# Patient Record
Sex: Male | Born: 1965 | Race: White | Hispanic: No | Smoking: Never smoker
Health system: Southern US, Community
[De-identification: ages and names within clinical notes are randomized; demographics above are authoritative.]

## PROBLEM LIST (undated history)

## (undated) DIAGNOSIS — F1921 Other psychoactive substance dependence, in remission: Secondary | ICD-10-CM

## (undated) DIAGNOSIS — M51369 Other intervertebral disc degeneration, lumbar region without mention of lumbar back pain or lower extremity pain: Secondary | ICD-10-CM

## (undated) DIAGNOSIS — M7580 Other shoulder lesions, unspecified shoulder: Secondary | ICD-10-CM

## (undated) DIAGNOSIS — M4800 Spinal stenosis, site unspecified: Secondary | ICD-10-CM

## (undated) DIAGNOSIS — M1990 Unspecified osteoarthritis, unspecified site: Secondary | ICD-10-CM

## (undated) DIAGNOSIS — Z8489 Family history of other specified conditions: Secondary | ICD-10-CM

## (undated) DIAGNOSIS — K429 Umbilical hernia without obstruction or gangrene: Secondary | ICD-10-CM

## (undated) DIAGNOSIS — F319 Bipolar disorder, unspecified: Secondary | ICD-10-CM

## (undated) DIAGNOSIS — R51 Headache: Secondary | ICD-10-CM

## (undated) DIAGNOSIS — G8929 Other chronic pain: Secondary | ICD-10-CM

## (undated) DIAGNOSIS — M5136 Other intervertebral disc degeneration, lumbar region: Secondary | ICD-10-CM

## (undated) DIAGNOSIS — I10 Essential (primary) hypertension: Secondary | ICD-10-CM

## (undated) HISTORY — PX: CYSTECTOMY: SUR359

## (undated) HISTORY — PX: MULTIPLE TOOTH EXTRACTIONS: SHX2053

## (undated) HISTORY — DX: Other psychoactive substance dependence, in remission: F19.21

---

## 2000-10-28 ENCOUNTER — Emergency Department (HOSPITAL_COMMUNITY): Admission: EM | Admit: 2000-10-28 | Discharge: 2000-10-28 | Payer: Self-pay | Admitting: Emergency Medicine

## 2001-11-07 ENCOUNTER — Encounter: Payer: Self-pay | Admitting: Internal Medicine

## 2001-11-07 ENCOUNTER — Emergency Department (HOSPITAL_COMMUNITY): Admission: EM | Admit: 2001-11-07 | Discharge: 2001-11-07 | Payer: Self-pay | Admitting: Internal Medicine

## 2002-05-15 ENCOUNTER — Emergency Department (HOSPITAL_COMMUNITY): Admission: EM | Admit: 2002-05-15 | Discharge: 2002-05-15 | Payer: Self-pay | Admitting: Emergency Medicine

## 2002-05-15 ENCOUNTER — Encounter: Payer: Self-pay | Admitting: Internal Medicine

## 2002-09-28 ENCOUNTER — Emergency Department (HOSPITAL_COMMUNITY): Admission: EM | Admit: 2002-09-28 | Discharge: 2002-09-28 | Payer: Self-pay | Admitting: Emergency Medicine

## 2010-04-10 ENCOUNTER — Emergency Department (HOSPITAL_COMMUNITY)
Admission: EM | Admit: 2010-04-10 | Discharge: 2010-04-10 | Disposition: A | Discharge status: Home / Self Care | Payer: Self-pay | Attending: Emergency Medicine | Admitting: Emergency Medicine

## 2010-04-10 DIAGNOSIS — M545 Low back pain, unspecified: Secondary | ICD-10-CM | POA: Insufficient documentation

## 2010-04-26 ENCOUNTER — Emergency Department (HOSPITAL_COMMUNITY)
Admission: EM | Admit: 2010-04-26 | Discharge: 2010-04-26 | Disposition: A | Discharge status: Home / Self Care | Payer: Self-pay | Attending: Emergency Medicine | Admitting: Emergency Medicine

## 2010-04-26 DIAGNOSIS — M545 Low back pain, unspecified: Secondary | ICD-10-CM | POA: Insufficient documentation

## 2010-04-26 DIAGNOSIS — F411 Generalized anxiety disorder: Secondary | ICD-10-CM | POA: Insufficient documentation

## 2010-04-26 DIAGNOSIS — M129 Arthropathy, unspecified: Secondary | ICD-10-CM | POA: Insufficient documentation

## 2010-04-26 DIAGNOSIS — F319 Bipolar disorder, unspecified: Secondary | ICD-10-CM | POA: Insufficient documentation

## 2010-05-20 ENCOUNTER — Emergency Department (HOSPITAL_COMMUNITY)
Admission: EM | Admit: 2010-05-20 | Discharge: 2010-05-20 | Disposition: A | Discharge status: Home / Self Care | Payer: Self-pay | Attending: Emergency Medicine | Admitting: Emergency Medicine

## 2010-05-20 DIAGNOSIS — F411 Generalized anxiety disorder: Secondary | ICD-10-CM | POA: Insufficient documentation

## 2010-05-20 DIAGNOSIS — M129 Arthropathy, unspecified: Secondary | ICD-10-CM | POA: Insufficient documentation

## 2010-05-20 DIAGNOSIS — IMO0002 Reserved for concepts with insufficient information to code with codable children: Secondary | ICD-10-CM | POA: Insufficient documentation

## 2010-05-20 DIAGNOSIS — F319 Bipolar disorder, unspecified: Secondary | ICD-10-CM | POA: Insufficient documentation

## 2010-07-11 ENCOUNTER — Emergency Department (HOSPITAL_COMMUNITY)
Admission: EM | Admit: 2010-07-11 | Discharge: 2010-07-11 | Disposition: A | Discharge status: Home / Self Care | Payer: Self-pay | Attending: Emergency Medicine | Admitting: Emergency Medicine

## 2010-07-11 DIAGNOSIS — M545 Low back pain, unspecified: Secondary | ICD-10-CM | POA: Insufficient documentation

## 2010-07-30 ENCOUNTER — Encounter: Payer: Self-pay | Admitting: Emergency Medicine

## 2010-07-30 ENCOUNTER — Emergency Department (HOSPITAL_COMMUNITY)
Admission: EM | Admit: 2010-07-30 | Discharge: 2010-07-30 | Disposition: A | Discharge status: Home / Self Care | Payer: Self-pay | Attending: Emergency Medicine | Admitting: Emergency Medicine

## 2010-07-30 DIAGNOSIS — M545 Low back pain, unspecified: Secondary | ICD-10-CM | POA: Insufficient documentation

## 2010-07-30 DIAGNOSIS — F319 Bipolar disorder, unspecified: Secondary | ICD-10-CM | POA: Insufficient documentation

## 2010-07-30 DIAGNOSIS — F988 Other specified behavioral and emotional disorders with onset usually occurring in childhood and adolescence: Secondary | ICD-10-CM | POA: Insufficient documentation

## 2010-07-30 DIAGNOSIS — X500XXA Overexertion from strenuous movement or load, initial encounter: Secondary | ICD-10-CM | POA: Insufficient documentation

## 2010-07-30 DIAGNOSIS — G8929 Other chronic pain: Secondary | ICD-10-CM | POA: Insufficient documentation

## 2010-07-30 HISTORY — DX: Other chronic pain: G89.29

## 2010-07-30 HISTORY — DX: Bipolar disorder, unspecified: F31.9

## 2010-07-30 MED ORDER — PREDNISONE 10 MG PO TABS: 10.0000 mg | ORAL_TABLET | Freq: Every day | ORAL | Status: AC

## 2010-07-30 MED ORDER — CYCLOBENZAPRINE HCL 10 MG PO TABS: 10.0000 mg | ORAL_TABLET | Freq: Three times a day (TID) | ORAL | Status: AC | PRN

## 2010-07-30 MED ORDER — OXYCODONE-ACETAMINOPHEN 5-325 MG PO TABS: 1.0000 | ORAL_TABLET | ORAL | Status: AC | PRN

## 2010-07-30 MED ORDER — OXYCODONE-ACETAMINOPHEN 5-325 MG PO TABS
1.0000 | ORAL_TABLET | Freq: Once | ORAL | Status: AC
  Administered 2010-07-30: 1 via ORAL
  Filled 2010-07-30: qty 1

## 2010-07-30 NOTE — ED Notes (Signed)
Pt c/o chronic back pain. Moving furniture yesterday and now c/olower back pain that is radiating down L leg. NAD at this time. Rating pain 10 with movment, 8 without.

## 2010-07-30 NOTE — ED Provider Notes (Signed)
History     Chief Complaint  Patient presents with  . Back Pain   Patient is a 45 y.o. male presenting with back pain. The history is provided by the patient.  Back Pain  This is a recurrent problem. The current episode started yesterday. The problem occurs constantly. The problem has been gradually worsening. The pain is associated with lifting heavy objects and twisting. The pain is present in the lumbar spine. The quality of the pain is described as burning and stabbing. The pain radiates to the left thigh. The pain is moderate. The symptoms are aggravated by bending, certain positions and twisting. The pain is the same all the time. Stiffness is present in the morning. Pertinent negatives include no fever, no numbness, no abdominal pain, no bowel incontinence, no perianal numbness, no bladder incontinence, no dysuria, no paresthesias, no tingling and no weakness. He has tried nothing for the symptoms. Risk factors include obesity.    Past Medical History  Diagnosis Date  . Bipolar 1 disorder   . Attention deficit disorder   . Chronic pain     Past Surgical History  Procedure Date  . Cystectomy     History reviewed. No pertinent family history.  History  Substance Use Topics  . Smoking status: Never Smoker   . Smokeless tobacco: Not on file  . Alcohol Use: No      Review of Systems  Constitutional: Negative.  Negative for fever.  HENT: Negative for neck pain and neck stiffness.   Respiratory: Negative.   Cardiovascular: Negative.   Gastrointestinal: Negative.  Negative for abdominal pain and bowel incontinence.  Genitourinary: Negative for bladder incontinence, dysuria and frequency.       No incontinence  Musculoskeletal: Positive for back pain. Negative for myalgias and gait problem.  Skin: Negative.   Neurological: Negative for tingling, weakness, numbness and paresthesias.    Physical Exam  BP 151/87  Pulse 75  Temp(Src) 98.6 F (37 C) (Oral)  Resp 19   SpO2 97%  Physical Exam  Constitutional: He is oriented to person, place, and time. Vital signs are normal. He appears well-developed and well-nourished.  Non-toxic appearance.  HENT:  Head: Normocephalic and atraumatic.  Eyes: Pupils are equal, round, and reactive to light.  Neck: Neck supple.  Cardiovascular: Normal rate, regular rhythm and normal heart sounds.   Pulmonary/Chest: Effort normal and breath sounds normal.  Abdominal: Soft. Bowel sounds are normal.  Musculoskeletal: He exhibits tenderness.       Lumbar back: He exhibits pain. He exhibits no tenderness, no swelling and no edema.       Back:  Lymphadenopathy:    He has no cervical adenopathy.  Neurological: He is alert and oriented to person, place, and time.  Skin: Skin is warm and dry.    ED Course  Procedures  MDM  ttp of the left lumbar paraspinal muscles.  Patient is ambulatory, no focal neuro deficits .  Distal sensation intact      Davan Hark L. [place], [place] 07/30/10 [phone]

## 2010-08-30 ENCOUNTER — Emergency Department (HOSPITAL_COMMUNITY)
Admission: EM | Admit: 2010-08-30 | Discharge: 2010-08-30 | Disposition: A | Discharge status: Home / Self Care | Payer: Self-pay | Attending: Emergency Medicine | Admitting: Emergency Medicine

## 2010-08-30 ENCOUNTER — Emergency Department (HOSPITAL_COMMUNITY): Payer: Self-pay

## 2010-08-30 ENCOUNTER — Encounter (HOSPITAL_COMMUNITY): Payer: Self-pay | Admitting: *Deleted

## 2010-08-30 DIAGNOSIS — M545 Low back pain, unspecified: Secondary | ICD-10-CM | POA: Insufficient documentation

## 2010-08-30 DIAGNOSIS — M25571 Pain in right ankle and joints of right foot: Secondary | ICD-10-CM

## 2010-08-30 DIAGNOSIS — M25579 Pain in unspecified ankle and joints of unspecified foot: Secondary | ICD-10-CM | POA: Insufficient documentation

## 2010-08-30 MED ORDER — DICLOFENAC SODIUM 75 MG PO TBEC: 75.0000 mg | DELAYED_RELEASE_TABLET | Freq: Two times a day (BID) | ORAL | Status: AC

## 2010-08-30 MED ORDER — OXYCODONE-ACETAMINOPHEN 5-325 MG PO TABS
1.0000 | ORAL_TABLET | Freq: Once | ORAL | Status: AC
  Administered 2010-08-30: 1 via ORAL
  Filled 2010-08-30: qty 1

## 2010-08-30 MED ORDER — OXYCODONE-ACETAMINOPHEN 5-325 MG PO TABS: 1.0000 | ORAL_TABLET | ORAL | Status: AC | PRN

## 2010-08-30 NOTE — ED Notes (Signed)
Patient with back pain x 2-3 days, bilat. Ankle pain for a week

## 2010-08-30 NOTE — ED Notes (Signed)
Ankle splint applied to R ankle.

## 2010-08-30 NOTE — ED Provider Notes (Signed)
History     CSN: [id number] Arrival date & time: 08/30/2010  6:53 PM  Chief Complaint  Patient presents with  . Back Pain  . Ankle Pain   HPI Comments: (atient c/o low back pain for 3 days.  States the pain is worse with movement.  He denies pain radiating into his legs, numbness or weakness or incontinence of feces or urine.  States he has had similar back pains in the past.  He also c/o aching pain in his bilateral ankles but right ankle pain is worse than left.  He denies redness, swelling or injury of the back or ankles.  Ankle pain is also worse with movement and standing.  The pain does not radiate to his calf.  Patient is a 45 y.o. male presenting with back pain. The history is provided by the patient.  Back Pain  The current episode started more than 2 days ago. The problem occurs constantly. The problem has not changed since onset.The pain is associated with no known injury. The pain is present in the lumbar spine. The quality of the pain is described as aching. The pain does not radiate. The pain is moderate. The symptoms are aggravated by bending, twisting and certain positions. The pain is the same all the time. Pertinent negatives include no chest pain, no fever, no numbness, no headaches, no abdominal pain, no bowel incontinence, no perianal numbness, no bladder incontinence, no pelvic pain, no leg pain, no paresthesias, no tingling and no weakness. He has tried analgesics for the symptoms. The treatment provided no relief.    Past Medical History  Diagnosis Date  . Bipolar 1 disorder   . Attention deficit disorder   . Chronic pain     Past Surgical History  Procedure Date  . Cystectomy     History reviewed. No pertinent family history.  History  Substance Use Topics  . Smoking status: Never Smoker   . Smokeless tobacco: Not on file  . Alcohol Use: No      Review of Systems  Constitutional: Negative for fever.  Cardiovascular: Negative for chest pain.    Gastrointestinal: Negative for abdominal pain and bowel incontinence.  Genitourinary: Negative for bladder incontinence and pelvic pain.  Musculoskeletal: Positive for back pain and arthralgias.  Neurological: Negative for tingling, weakness, numbness, headaches and paresthesias.  All other systems reviewed and are negative.    Physical Exam  BP 140/85  Pulse 67  Temp(Src) 98.3 F (36.8 C) (Oral)  Resp 20  Ht 5\' 11"  (1.803 m)  Wt 285 lb (129.275 kg)  BMI 39.75 kg/m2  SpO2 98%  Physical Exam  Nursing note and vitals reviewed. Constitutional: He is oriented to person, place, and time. He appears well-developed and well-nourished. No distress.  HENT:  Head: Normocephalic and atraumatic.  Mouth/Throat: Oropharynx is clear and moist.  Neck: Normal range of motion. Neck supple. No JVD present.  Cardiovascular: Normal rate, regular rhythm and normal heart sounds.   Pulmonary/Chest: Effort normal and breath sounds normal.  Musculoskeletal: He exhibits tenderness. He exhibits no edema.       Lumbar back: He exhibits tenderness. He exhibits normal range of motion, no bony tenderness and no swelling.       Back:       Feet:  Lymphadenopathy:    He has no cervical adenopathy.  Neurological: He is alert and oriented to person, place, and time. He has normal reflexes. He displays normal reflexes. No cranial nerve deficit. Coordination normal.  Skin:  Skin is warm and dry.    ED Course  Procedures  MDM   ttp of the right medial and lateral ankle.  No edema, erythema, crepitus or bruising.  Patient is ambulatory with a limp.   No calf pain or hx of gout.  ASO splint applied to the right ankle by the nursing staff.  Neurovascular recheck of ankle intact.        Quantel Mcinturff L. [place], [place] 09/07/10 1844

## 2010-09-13 NOTE — ED Provider Notes (Signed)
Medical screening examination/treatment/procedure(s) were performed by non-physician practitioner and as supervising physician I was immediately available for consultation/collaboration.  Jasmine Awe, MD 09/13/10 [phone]

## 2010-09-30 ENCOUNTER — Emergency Department (HOSPITAL_COMMUNITY)
Admission: EM | Admit: 2010-09-30 | Discharge: 2010-09-30 | Disposition: A | Discharge status: Home / Self Care | Payer: Medicaid Other | Attending: Emergency Medicine | Admitting: Emergency Medicine

## 2010-09-30 ENCOUNTER — Encounter (HOSPITAL_COMMUNITY): Payer: Self-pay

## 2010-09-30 DIAGNOSIS — IMO0002 Reserved for concepts with insufficient information to code with codable children: Secondary | ICD-10-CM | POA: Insufficient documentation

## 2010-09-30 DIAGNOSIS — X500XXA Overexertion from strenuous movement or load, initial encounter: Secondary | ICD-10-CM | POA: Insufficient documentation

## 2010-09-30 DIAGNOSIS — M549 Dorsalgia, unspecified: Secondary | ICD-10-CM

## 2010-09-30 DIAGNOSIS — M545 Low back pain, unspecified: Secondary | ICD-10-CM | POA: Insufficient documentation

## 2010-09-30 HISTORY — DX: Unspecified osteoarthritis, unspecified site: M19.90

## 2010-09-30 HISTORY — DX: Other intervertebral disc degeneration, lumbar region without mention of lumbar back pain or lower extremity pain: M51.369

## 2010-09-30 HISTORY — DX: Other intervertebral disc degeneration, lumbar region: M51.36

## 2010-09-30 HISTORY — DX: Other shoulder lesions, unspecified shoulder: M75.80

## 2010-09-30 MED ORDER — MORPHINE SULFATE 4 MG/ML IJ SOLN
4.0000 mg | Freq: Once | INTRAMUSCULAR | Status: AC
  Administered 2010-09-30: 4 mg via INTRAMUSCULAR
  Filled 2010-09-30: qty 1

## 2010-09-30 MED ORDER — METHOCARBAMOL 500 MG PO TABS
1000.0000 mg | ORAL_TABLET | Freq: Once | ORAL | Status: AC
  Administered 2010-09-30: 1000 mg via ORAL
  Filled 2010-09-30: qty 2

## 2010-09-30 MED ORDER — ACETAMINOPHEN-CODEINE #3 300-30 MG PO TABS: 1.0000 | ORAL_TABLET | ORAL | Status: AC | PRN

## 2010-09-30 MED ORDER — DICLOFENAC SODIUM 75 MG PO TBEC: 75.0000 mg | DELAYED_RELEASE_TABLET | Freq: Two times a day (BID) | ORAL | Status: DC

## 2010-09-30 MED ORDER — PROMETHAZINE HCL 12.5 MG PO TABS
25.0000 mg | ORAL_TABLET | Freq: Once | ORAL | Status: AC
  Administered 2010-09-30: 25 mg via ORAL
  Filled 2010-09-30: qty 1

## 2010-09-30 NOTE — ED Provider Notes (Signed)
Medical screening examination/treatment/procedure(s) were performed by non-physician practitioner and as supervising physician I was immediately available for consultation/collaboration.   Joya Gaskins, MD 09/30/10 [phone]

## 2010-09-30 NOTE — ED Provider Notes (Signed)
History     CSN: [id number] Arrival date & time: 09/30/2010  3:20 PM  Chief Complaint  Patient presents with  . Back Pain  . Back Injury   HPI Comments: Pt states he was moving wood from a truck and positioning the tongue of trailer and injured the lower back. He has hx of low back pain.  Patient is a 45 y.o. male presenting with back pain. The history is provided by the patient.  Back Pain  This is a recurrent problem. The current episode started yesterday. The problem occurs constantly. The problem has been gradually worsening. The pain is associated with lifting heavy objects. The pain is present in the lumbar spine. The quality of the pain is described as aching. The pain is at a severity of 10/10. The pain is moderate. The symptoms are aggravated by certain positions. He has tried nothing for the symptoms.    Past Medical History  Diagnosis Date  . Bipolar 1 disorder   . Attention deficit disorder   . Chronic pain   . AC (acromioclavicular) joint bone spurs   . Arthritis   . Degenerative disc disease, lumbar     Past Surgical History  Procedure Date  . Cystectomy     No family history on file.  History  Substance Use Topics  . Smoking status: Never Smoker   . Smokeless tobacco: Not on file  . Alcohol Use: No      Review of Systems  Musculoskeletal: Positive for back pain.    Physical Exam  BP 176/88  Pulse 73  Resp 20  Ht 5\' 11"  (1.803 m)  Wt 285 lb (129.275 kg)  BMI 39.75 kg/m2  SpO2 98%  Physical Exam  Nursing note and vitals reviewed. Constitutional: He is oriented to person, place, and time. He appears well-developed and well-nourished.  Non-toxic appearance.  HENT:  Head: Normocephalic.  Right Ear: Tympanic membrane and external ear normal.  Left Ear: Tympanic membrane and external ear normal.  Eyes: EOM and lids are normal. Pupils are equal, round, and reactive to light.  Neck: Normal range of motion. Neck supple. Carotid bruit is not present.    Cardiovascular: Normal rate, regular rhythm, normal heart sounds, intact distal pulses and normal pulses.   Pulmonary/Chest: Breath sounds normal. No respiratory distress.  Abdominal: Soft. Bowel sounds are normal. There is no tenderness. There is no guarding.  Musculoskeletal: Normal range of motion.       Lumbar area pain to palpation and ROM.  Lymphadenopathy:       Head (right side): No submandibular adenopathy present.       Head (left side): No submandibular adenopathy present.    He has no cervical adenopathy.  Neurological: He is alert and oriented to person, place, and time. He has normal strength. No cranial nerve deficit or sensory deficit. He exhibits normal muscle tone. Coordination normal. GCS eye subscore is 4. GCS verbal subscore is 5. GCS motor subscore is 6.  Skin: Skin is warm and dry.  Psychiatric: He has a normal mood and affect. His speech is normal.    ED Course: Pt advised to rest back. Use voltaren and tylenol -codeine for discomfort. Pt to see Dr Eduard Clos for additional evaluation.  Procedures  MDM I have reviewed nursing notes, vital signs, and all appropriate lab and imaging results for this patient.      Kathie Dike, [place] 09/30/10 [phone]

## 2010-09-30 NOTE — ED Notes (Signed)
Pt reports injury to his back yesterday while moving some wood off a truck.  Pt has hx of back problems.

## 2010-10-12 ENCOUNTER — Emergency Department (HOSPITAL_COMMUNITY)
Admission: EM | Admit: 2010-10-12 | Discharge: 2010-10-13 | Disposition: A | Discharge status: Home / Self Care | Payer: Medicaid Other | Attending: Emergency Medicine | Admitting: Emergency Medicine

## 2010-10-12 ENCOUNTER — Encounter (HOSPITAL_COMMUNITY): Payer: Self-pay | Admitting: Emergency Medicine

## 2010-10-12 DIAGNOSIS — M549 Dorsalgia, unspecified: Secondary | ICD-10-CM | POA: Insufficient documentation

## 2010-10-12 DIAGNOSIS — M129 Arthropathy, unspecified: Secondary | ICD-10-CM | POA: Insufficient documentation

## 2010-10-12 DIAGNOSIS — M51379 Other intervertebral disc degeneration, lumbosacral region without mention of lumbar back pain or lower extremity pain: Secondary | ICD-10-CM | POA: Insufficient documentation

## 2010-10-12 DIAGNOSIS — F319 Bipolar disorder, unspecified: Secondary | ICD-10-CM | POA: Insufficient documentation

## 2010-10-12 DIAGNOSIS — G8929 Other chronic pain: Secondary | ICD-10-CM | POA: Insufficient documentation

## 2010-10-12 DIAGNOSIS — M5137 Other intervertebral disc degeneration, lumbosacral region: Secondary | ICD-10-CM | POA: Insufficient documentation

## 2010-10-12 MED ORDER — METHOCARBAMOL 500 MG PO TABS: ORAL_TABLET | ORAL | Status: DC

## 2010-10-12 MED ORDER — OXYCODONE-ACETAMINOPHEN 5-325 MG PO TABS
1.0000 | ORAL_TABLET | Freq: Once | ORAL | Status: AC
  Administered 2010-10-13: 1 via ORAL
  Filled 2010-10-12: qty 1

## 2010-10-12 MED ORDER — METHOCARBAMOL 500 MG PO TABS
1000.0000 mg | ORAL_TABLET | Freq: Once | ORAL | Status: AC
  Administered 2010-10-13: 1000 mg via ORAL
  Filled 2010-10-12: qty 2

## 2010-10-12 MED ORDER — OXYCODONE-ACETAMINOPHEN 5-325 MG PO TABS: 1.0000 | ORAL_TABLET | ORAL | Status: AC | PRN

## 2010-10-12 MED ORDER — PREDNISONE 10 MG PO TABS: ORAL_TABLET | ORAL | Status: AC

## 2010-10-12 NOTE — ED Provider Notes (Signed)
History     CSN: [id number] Arrival date & time: 10/12/2010  9:07 PM  Chief Complaint  Patient presents with  . Back Pain    HPI  (Consider location/radiation/quality/duration/timing/severity/associated sxs/prior treatment)  Patient is a 45 y.o. male presenting with back pain. The history is provided by the patient.  Back Pain  This is a recurrent problem. The current episode started more than 1 week ago. The problem occurs constantly. The problem has been gradually worsening. The pain is associated with no known injury. The pain is present in the lumbar spine. The quality of the pain is described as shooting and aching. The pain radiates to the left thigh. The pain is moderate. The symptoms are aggravated by bending and certain positions. The pain is the same all the time. Associated symptoms include leg pain and tingling. Pertinent negatives include no chest pain, no fever, no numbness, no abdominal pain, no abdominal swelling, no bowel incontinence, no perianal numbness, no bladder incontinence, no dysuria, no pelvic pain, no paresthesias and no weakness. He has tried analgesics and NSAIDs for the symptoms. The treatment provided mild relief. Risk factors include obesity.    Past Medical History  Diagnosis Date  . Bipolar 1 disorder   . Attention deficit disorder   . Chronic pain   . AC (acromioclavicular) joint bone spurs   . Arthritis   . Degenerative disc disease, lumbar     Past Surgical History  Procedure Date  . Cystectomy     History reviewed. No pertinent family history.  History  Substance Use Topics  . Smoking status: Never Smoker   . Smokeless tobacco: Not on file  . Alcohol Use: No      Review of Systems  Review of Systems  Constitutional: Negative for fever.  Cardiovascular: Negative for chest pain.  Gastrointestinal: Negative for abdominal pain and bowel incontinence.  Genitourinary: Negative for bladder incontinence, dysuria, urgency, hematuria,  decreased urine volume, difficulty urinating and pelvic pain.  Musculoskeletal: Positive for back pain and gait problem. Negative for myalgias and joint swelling.  Skin: Negative.   Neurological: Positive for tingling. Negative for weakness, numbness and paresthesias.  Hematological: Negative for adenopathy. Does not bruise/bleed easily.  All other systems reviewed and are negative.    Allergies  Review of patient's allergies indicates no known allergies.  Home Medications   Current Outpatient Rx  Name Route Sig Dispense Refill  . ACETAMINOPHEN 500 MG PO TABS Oral Take 1,000 mg by mouth as needed. For pain     . SALINE SENSITIVE EYES SOLN Does not apply 1 drop by Does not apply route as needed. CONTACTS     . TYLENOL PO Oral Take 1,000 mg by mouth as needed. For pain    . DICLOFENAC SODIUM 75 MG PO TBEC Oral Take 1 tablet (75 mg total) by mouth 2 (two) times daily. Take with food 20 tablet 0  . DICLOFENAC SODIUM 75 MG PO TBEC Oral Take 1 tablet (75 mg total) by mouth 2 (two) times daily. 14 tablet 0    Physical Exam    BP 163/99  Pulse 80  Temp(Src) 98.7 F (37.1 C) (Oral)  Resp 20  Ht 6' (1.829 m)  Wt 285 lb (129.275 kg)  BMI 38.65 kg/m2  SpO2 100%  Physical Exam  Nursing note and vitals reviewed. Constitutional: He is oriented to person, place, and time. He appears well-developed and well-nourished. No distress.  HENT:  Head: Normocephalic and atraumatic.  Neck: Normal range of motion.  Neck supple.  Cardiovascular: Normal rate, regular rhythm and normal heart sounds.   Abdominal: Soft. He exhibits no mass. There is no tenderness. There is no rebound and no guarding.  Musculoskeletal: He exhibits tenderness. He exhibits no edema.  Lymphadenopathy:    He has no cervical adenopathy.  Neurological: He is alert and oriented to person, place, and time. He has normal strength and normal reflexes. No cranial nerve deficit or sensory deficit. He exhibits normal muscle tone.  Coordination normal.  Reflex Scores:      Patellar reflexes are 2+ on the right side and 2+ on the left side.      Achilles reflexes are 2+ on the right side and 2+ on the left side. Skin: Skin is dry.    ED Course  Procedures (including critical care time)       MDM  11:48 PM patient has ttp of the left lumbar paraspinal muscles.  No focal neuro deficits, ambulates with slightly antalgic gait.  Patient has hx of chronic back pain.     I have reviewed the patient's previous medical records.  He has been evaluated here multiple times for back pain    Dewan Emond L. [place], [place] 10/12/10 2357

## 2010-10-12 NOTE — ED Notes (Signed)
Patient c/o low back pain that he states is chronic.

## 2010-10-12 NOTE — ED Notes (Signed)
C/o chronic lower back pain that has gotten worse

## 2010-10-24 NOTE — ED Provider Notes (Signed)
Medical screening examination/treatment/procedure(s) were performed by non-physician practitioner and as supervising physician I was immediately available for consultation/collaboration.   Madellyn Denio W. Riyanshi [place], MD 10/24/10 1251 

## 2010-10-24 NOTE — ED Provider Notes (Signed)
Medical screening examination/treatment/procedure(s) were performed by non-physician practitioner and as supervising physician I was immediately available for consultation/collaboration.  Nicoletta Dress. [place], MD 10/24/10 [phone]

## 2010-11-08 ENCOUNTER — Encounter (HOSPITAL_COMMUNITY): Payer: Self-pay

## 2010-11-08 ENCOUNTER — Ambulatory Visit (HOSPITAL_COMMUNITY): Payer: Self-pay | Admitting: Physical Therapy

## 2010-11-08 ENCOUNTER — Emergency Department (HOSPITAL_COMMUNITY)
Admission: EM | Admit: 2010-11-08 | Discharge: 2010-11-08 | Disposition: A | Discharge status: Home / Self Care | Payer: Medicaid Other | Attending: Emergency Medicine | Admitting: Emergency Medicine

## 2010-11-08 DIAGNOSIS — M545 Low back pain, unspecified: Secondary | ICD-10-CM | POA: Insufficient documentation

## 2010-11-08 DIAGNOSIS — M898X9 Other specified disorders of bone, unspecified site: Secondary | ICD-10-CM | POA: Insufficient documentation

## 2010-11-08 DIAGNOSIS — G8929 Other chronic pain: Secondary | ICD-10-CM | POA: Insufficient documentation

## 2010-11-08 DIAGNOSIS — M5137 Other intervertebral disc degeneration, lumbosacral region: Secondary | ICD-10-CM | POA: Insufficient documentation

## 2010-11-08 DIAGNOSIS — M47817 Spondylosis without myelopathy or radiculopathy, lumbosacral region: Secondary | ICD-10-CM | POA: Insufficient documentation

## 2010-11-08 DIAGNOSIS — M51379 Other intervertebral disc degeneration, lumbosacral region without mention of lumbar back pain or lower extremity pain: Secondary | ICD-10-CM | POA: Insufficient documentation

## 2010-11-08 DIAGNOSIS — M4800 Spinal stenosis, site unspecified: Secondary | ICD-10-CM | POA: Insufficient documentation

## 2010-11-08 DIAGNOSIS — M549 Dorsalgia, unspecified: Secondary | ICD-10-CM

## 2010-11-08 DIAGNOSIS — F319 Bipolar disorder, unspecified: Secondary | ICD-10-CM | POA: Insufficient documentation

## 2010-11-08 HISTORY — DX: Spinal stenosis, site unspecified: M48.00

## 2010-11-08 MED ORDER — MORPHINE SULFATE 4 MG/ML IJ SOLN
4.0000 mg | Freq: Once | INTRAMUSCULAR | Status: AC
  Administered 2010-11-08: 4 mg via INTRAMUSCULAR
  Filled 2010-11-08: qty 1

## 2010-11-08 MED ORDER — OXYCODONE-ACETAMINOPHEN 5-325 MG PO TABS: 2.0000 | ORAL_TABLET | ORAL | Status: AC | PRN

## 2010-11-08 NOTE — ED Notes (Signed)
Pt reports has arthritis in back, herniated discs, and spinal stenosis.  C/O pain in lower back.

## 2010-11-08 NOTE — ED Provider Notes (Signed)
History   This chart was scribed for Nicholes Stairs, MD by Clarita Crane. The patient was seen in room APA19/AP[facility]19 and the patient's care was started at 12:10PM.   CSN: [id number] Arrival date & time: 11/08/2010 11:21 AM   First MD Initiated Contact with Patient 11/08/10 1141      Chief Complaint  Patient presents with  . Back Pain   HPI Walter [name] is a 45 y.o. male who presents to the Emergency Department complaining of constant sharp lower back pain onset several months ago but significantly worse over the past several days with associated weakness and tingling of bilateral lower extremities. Denies nausea, vomiting, fever, chills, incontinence. Patient reports he was recently dx with spinal stenosis and is in the process of becoming a new patient of [facility] but has not been approved at this time.   Past Medical History  Diagnosis Date  . Bipolar 1 disorder   . Attention deficit disorder   . Chronic pain   . AC (acromioclavicular) joint bone spurs   . Arthritis   . Degenerative disc disease, lumbar   . Spinal stenosis     Past Surgical History  Procedure Date  . Cystectomy     No family history on file.  History  Substance Use Topics  . Smoking status: Never Smoker   . Smokeless tobacco: Not on file  . Alcohol Use: No      Review of Systems 10 Systems reviewed and are negative for acute change except as noted in the HPI.  Allergies  Review of patient's allergies indicates no known allergies.  Home Medications   Current Outpatient Rx  Name Route Sig Dispense Refill  . TYLENOL PO Oral Take 1,000 mg by mouth as needed. For pain    . ACETAMINOPHEN 500 MG PO TABS Oral Take 1,000 mg by mouth every 6 (six) hours as needed. For pain    . DICLOFENAC SODIUM 75 MG PO TBEC Oral Take 1 tablet (75 mg total) by mouth 2 (two) times daily. 14 tablet 0  . METHOCARBAMOL 500 MG PO TABS  Take two tabs po TID prn muscle spasms 42 tablet 0  . SALINE  SENSITIVE EYES SOLN Does not apply 1 drop by Does not apply route as needed. CONTACTS     . DICLOFENAC SODIUM 75 MG PO TBEC Oral Take 1 tablet (75 mg total) by mouth 2 (two) times daily. Take with food 20 tablet 0    BP 137/88  Pulse 69  Temp(Src) 98.3 F (36.8 C) (Oral)  Resp 20  Ht 5\' 11"  (1.803 m)  Wt 295 lb (133.811 kg)  BMI 41.14 kg/m2  SpO2 99%  Physical Exam  Nursing note and vitals reviewed. Constitutional: He is oriented to person, place, and time. He appears well-developed and well-nourished. No distress.  HENT:  Head: Normocephalic and atraumatic.  Eyes: EOM are normal.  Neck: Neck supple. No tracheal deviation present.  Cardiovascular: Normal rate.   Pulmonary/Chest: Effort normal. No respiratory distress.  Musculoskeletal: Normal range of motion. He exhibits no edema.       Entire spine non-tender.   Neurological: He is alert and oriented to person, place, and time. He has normal strength. No sensory deficit.  Skin: Skin is warm and dry.  Psychiatric: He has a normal mood and affect. His behavior is normal.    ED Course  Procedures (including critical care time)  DIAGNOSTIC STUDIES: Oxygen Saturation is 99% on room air, normal  by  my interpretation.    COORDINATION OF CARE:    Labs Reviewed - No data to display No results found.   No diagnosis found.    MDM  Back pain No history of trauma.  No systemic illness.  No weakness or paresthesias.  No neurological deficits.      I personally performed the services described in this documentation, which was scribed in my presence. The recorded information has been reviewed and considered.    Nicholes Stairs, MD 11/08/10 1220

## 2010-12-12 ENCOUNTER — Emergency Department (HOSPITAL_COMMUNITY)
Admission: EM | Admit: 2010-12-12 | Discharge: 2010-12-12 | Disposition: A | Discharge status: Home / Self Care | Payer: Medicaid Other | Attending: Emergency Medicine | Admitting: Emergency Medicine

## 2010-12-12 ENCOUNTER — Encounter (HOSPITAL_COMMUNITY): Payer: Self-pay | Admitting: Emergency Medicine

## 2010-12-12 DIAGNOSIS — M549 Dorsalgia, unspecified: Secondary | ICD-10-CM | POA: Insufficient documentation

## 2010-12-12 DIAGNOSIS — M898X9 Other specified disorders of bone, unspecified site: Secondary | ICD-10-CM | POA: Insufficient documentation

## 2010-12-12 DIAGNOSIS — M5137 Other intervertebral disc degeneration, lumbosacral region: Secondary | ICD-10-CM | POA: Insufficient documentation

## 2010-12-12 DIAGNOSIS — M51379 Other intervertebral disc degeneration, lumbosacral region without mention of lumbar back pain or lower extremity pain: Secondary | ICD-10-CM | POA: Insufficient documentation

## 2010-12-12 DIAGNOSIS — F319 Bipolar disorder, unspecified: Secondary | ICD-10-CM | POA: Insufficient documentation

## 2010-12-12 DIAGNOSIS — M129 Arthropathy, unspecified: Secondary | ICD-10-CM | POA: Insufficient documentation

## 2010-12-12 DIAGNOSIS — R209 Unspecified disturbances of skin sensation: Secondary | ICD-10-CM | POA: Insufficient documentation

## 2010-12-12 DIAGNOSIS — F988 Other specified behavioral and emotional disorders with onset usually occurring in childhood and adolescence: Secondary | ICD-10-CM | POA: Insufficient documentation

## 2010-12-12 DIAGNOSIS — M4800 Spinal stenosis, site unspecified: Secondary | ICD-10-CM | POA: Insufficient documentation

## 2010-12-12 DIAGNOSIS — G8929 Other chronic pain: Secondary | ICD-10-CM | POA: Insufficient documentation

## 2010-12-12 MED ORDER — METHOCARBAMOL 500 MG PO TABS: ORAL_TABLET | ORAL | Status: DC

## 2010-12-12 MED ORDER — DICLOFENAC SODIUM 75 MG PO TBEC: 75.0000 mg | DELAYED_RELEASE_TABLET | Freq: Two times a day (BID) | ORAL | Status: AC

## 2010-12-12 MED ORDER — KETOROLAC TROMETHAMINE 60 MG/2ML IM SOLN
60.0000 mg | Freq: Once | INTRAMUSCULAR | Status: AC
  Administered 2010-12-12: 60 mg via INTRAMUSCULAR
  Filled 2010-12-12: qty 2

## 2010-12-12 MED ORDER — OXYCODONE-ACETAMINOPHEN 5-325 MG PO TABS
2.0000 | ORAL_TABLET | Freq: Once | ORAL | Status: AC
  Administered 2010-12-12: 2 via ORAL
  Filled 2010-12-12: qty 2

## 2010-12-12 NOTE — ED Provider Notes (Signed)
History     CSN: [id number] Arrival date & time: 12/12/2010  5:24 AM   First MD Initiated Contact with Patient 12/12/10 0534      Chief Complaint  Patient presents with  . Back Pain    (Consider location/radiation/quality/duration/timing/severity/associated sxs/prior treatment) HPI Comments: Pt has history of chronic back pain.  He has had some numbness in his right leg. Pt is trying to see a back doctor but it make take until February.  Patient is a 45 y.o. male presenting with back pain. The history is provided by the patient.  Back Pain  This is a chronic problem. Episode onset: today. The problem occurs constantly. The problem has not changed since onset.The pain is associated with no known injury. The pain is present in the lumbar spine. The quality of the pain is described as stabbing. The pain is severe. The symptoms are aggravated by bending and twisting. Pertinent negatives include no abdominal pain, no abdominal swelling and no weakness.  Pt states he has had MRIs in the past showing pinched nerves and chronic degenerative changes.  Past Medical History  Diagnosis Date  . Bipolar 1 disorder   . Attention deficit disorder   . Chronic pain   . AC (acromioclavicular) joint bone spurs   . Arthritis   . Degenerative disc disease, lumbar   . Spinal stenosis     Past Surgical History  Procedure Date  . Cystectomy     History reviewed. No pertinent family history.  History  Substance Use Topics  . Smoking status: Never Smoker   . Smokeless tobacco: Not on file  . Alcohol Use: No      Review of Systems  Gastrointestinal: Negative for abdominal pain.  Musculoskeletal: Positive for back pain.  Neurological: Negative for weakness.  All other systems reviewed and are negative.    Allergies  Review of patient's allergies indicates no known allergies.  Home Medications   Current Outpatient Rx  Name Route Sig Dispense Refill  . TYLENOL PO Oral Take 1,000 mg  by mouth as needed. For pain    . ACETAMINOPHEN 500 MG PO TABS Oral Take 1,000 mg by mouth every 6 (six) hours as needed. For pain    . DICLOFENAC SODIUM 75 MG PO TBEC Oral Take 1 tablet (75 mg total) by mouth 2 (two) times daily. Take with food 20 tablet 0  . DICLOFENAC SODIUM 75 MG PO TBEC Oral Take 1 tablet (75 mg total) by mouth 2 (two) times daily. 14 tablet 0  . METHOCARBAMOL 500 MG PO TABS  Take two tabs po TID prn muscle spasms 42 tablet 0  . SALINE SENSITIVE EYES SOLN Does not apply 1 drop by Does not apply route as needed. CONTACTS       BP 180/88  Pulse 85  Temp(Src) 98.3 F (36.8 C) (Oral)  Resp 20  Ht 5\' 11"  (1.803 m)  Wt 310 lb (140.615 kg)  BMI 43.24 kg/m2  SpO2 96%  Physical Exam  Nursing note and vitals reviewed. Constitutional: He appears well-developed and well-nourished.  HENT:  Head: Normocephalic and atraumatic.  Right Ear: External ear normal.  Left Ear: External ear normal.  Nose: Nose normal.  Eyes: Conjunctivae and EOM are normal.  Neck: Neck supple. No tracheal deviation present.  Pulmonary/Chest: Effort normal. No stridor. No respiratory distress.  Musculoskeletal: He exhibits no edema and no tenderness.       Lumbar back: He exhibits decreased range of motion, tenderness, pain and spasm. He  exhibits no swelling and no edema.  Neurological: He is alert. He is not disoriented. No cranial nerve deficit or sensory deficit. He exhibits normal muscle tone. Coordination normal.  Skin: Skin is warm and dry. No rash noted. He is not diaphoretic. No erythema.  Psychiatric: He has a normal mood and affect. His behavior is normal. Thought content normal.    ED Course  Procedures (including critical care time)  Labs Reviewed - No data to display No results found.   1. Chronic back pain       MDM  Old records have been reviewed.  Pt has been coming to the ED multiple times in the past, almost monthly.  Pt given pain meds in the ED.  No sign of acute  neurological or vascular emergency associated with pt's back pain.  May have a component of sciatica.  Safe for outpatient follow up.  Discussed with patient chronic pain management. I do not recommend continuing percocet for pain.         Celene Kras, MD 12/12/10 [phone]

## 2010-12-12 NOTE — ED Notes (Signed)
Patient c/o back pain and numbness in right leg starting last night. Patient has history of same. States "it has never hurt this bad before."

## 2012-01-17 ENCOUNTER — Emergency Department (HOSPITAL_COMMUNITY)
Admission: EM | Admit: 2012-01-17 | Discharge: 2012-01-17 | Discharge status: Against Medical Advice | Payer: Medicaid Other

## 2012-01-23 ENCOUNTER — Encounter (HOSPITAL_COMMUNITY): Payer: Self-pay

## 2012-01-23 ENCOUNTER — Emergency Department (HOSPITAL_COMMUNITY): Payer: Medicaid Other

## 2012-01-23 ENCOUNTER — Emergency Department (HOSPITAL_COMMUNITY)
Admission: EM | Admit: 2012-01-23 | Discharge: 2012-01-23 | Disposition: A | Discharge status: Home / Self Care | Payer: Medicaid Other | Attending: Emergency Medicine | Admitting: Emergency Medicine

## 2012-01-23 DIAGNOSIS — W010XXA Fall on same level from slipping, tripping and stumbling without subsequent striking against object, initial encounter: Secondary | ICD-10-CM | POA: Insufficient documentation

## 2012-01-23 DIAGNOSIS — Z8659 Personal history of other mental and behavioral disorders: Secondary | ICD-10-CM | POA: Insufficient documentation

## 2012-01-23 DIAGNOSIS — Y939 Activity, unspecified: Secondary | ICD-10-CM | POA: Insufficient documentation

## 2012-01-23 DIAGNOSIS — Y929 Unspecified place or not applicable: Secondary | ICD-10-CM | POA: Insufficient documentation

## 2012-01-23 DIAGNOSIS — S92352A Displaced fracture of fifth metatarsal bone, left foot, initial encounter for closed fracture: Secondary | ICD-10-CM

## 2012-01-23 DIAGNOSIS — Z8739 Personal history of other diseases of the musculoskeletal system and connective tissue: Secondary | ICD-10-CM | POA: Insufficient documentation

## 2012-01-23 DIAGNOSIS — S92309A Fracture of unspecified metatarsal bone(s), unspecified foot, initial encounter for closed fracture: Secondary | ICD-10-CM | POA: Insufficient documentation

## 2012-01-23 MED ORDER — OXYCODONE-ACETAMINOPHEN 5-325 MG PO TABS: ORAL_TABLET | ORAL | Status: DC

## 2012-01-23 MED ORDER — OXYCODONE-ACETAMINOPHEN 5-325 MG PO TABS
1.0000 | ORAL_TABLET | Freq: Once | ORAL | Status: AC
  Administered 2012-01-23: 1 via ORAL
  Filled 2012-01-23: qty 1

## 2012-01-23 NOTE — ED Notes (Signed)
Pt reports tripped over a baby gate last Thursday.  C/O pain to left foot.  Bruising noted to left 2nd, 3rd, and 4 toes, and c/o severe pain to left lateral part of foot. Pedal pulse present, pt can wiggle toes but not without pain.

## 2012-01-23 NOTE — ED Provider Notes (Signed)
History     CSN: [id number]  Arrival date & time 01/23/12  1017   First MD Initiated Contact with Patient 01/23/12 1053      Chief Complaint  Patient presents with  . Foot Pain    (Consider location/radiation/quality/duration/timing/severity/associated sxs/prior treatment) HPI Comments: Stepped over a baby-gate and caught his toe causing him to fall and injuring L foot.  Went to [facility] and has known 5th MT fx..  States he thinks he would feel much better if we could fit him with a splint of some kind and some crutches to help him get around.   Patient is a 47 y.o. male presenting with lower extremity pain. The history is provided by the patient. No language interpreter was used.  Foot Pain This is a new problem. Episode onset: 1 week ago. The problem occurs constantly. The problem has been gradually worsening. Pertinent negatives include no fever. The symptoms are aggravated by walking and standing.    Past Medical History  Diagnosis Date  . Bipolar 1 disorder   . Attention deficit disorder   . Chronic pain   . AC (acromioclavicular) joint bone spurs   . Arthritis   . Degenerative disc disease, lumbar   . Spinal stenosis     Past Surgical History  Procedure Date  . Cystectomy     No family history on file.  History  Substance Use Topics  . Smoking status: Never Smoker   . Smokeless tobacco: Not on file  . Alcohol Use: No      Review of Systems  Constitutional: Negative for fever.  Musculoskeletal:       Foot injury  Skin: Negative for wound.  All other systems reviewed and are negative.    Allergies  Review of patient's allergies indicates no known allergies.  Home Medications   Current Outpatient Rx  Name  Route  Sig  Dispense  Refill  . ACETAMINOPHEN 500 MG PO TABS   Oral   Take 1,000 mg by mouth every 6 (six) hours as needed. For pain         . SALINE SENSITIVE EYES SOLN   Does not apply   1 drop by Does not apply route as needed. CONTACTS            . OXYCODONE-ACETAMINOPHEN 5-325 MG PO TABS      One tab po q 6 hrs prn pain   15 tablet   0     BP 154/81  Pulse 79  Temp 98.4 F (36.9 C) (Oral)  Resp 20  Ht 5\' 11"  (1.803 m)  Wt 295 lb (133.811 kg)  BMI 41.14 kg/m2  SpO2 98%  Physical Exam  Nursing note and vitals reviewed. Constitutional: He is oriented to person, place, and time. He appears well-developed and well-nourished.  HENT:  Head: Normocephalic and atraumatic.  Eyes: EOM are normal.  Neck: Normal range of motion.  Cardiovascular: Normal rate, regular rhythm and intact distal pulses.   Pulmonary/Chest: Effort normal. No respiratory distress.  Abdominal: Soft. He exhibits no distension. There is no tenderness.  Musculoskeletal: He exhibits tenderness.       Left foot: He exhibits decreased range of motion, tenderness, bony tenderness and swelling. He exhibits normal capillary refill, no crepitus, no deformity and no laceration.       Feet:  Neurological: He is alert and oriented to person, place, and time.  Skin: Skin is warm and dry.  Psychiatric: He has a normal mood and affect. Judgment normal.  ED Course  Procedures (including critical care time)  Labs Reviewed - No data to display Dg Foot Complete Left  01/23/2012  *RADIOLOGY REPORT*  Clinical Data: Pain and bruising of the fifth digit secondary to blunt trauma.  LEFT FOOT - COMPLETE 3+ VIEW  Comparison: 01/17/2012  Findings: Again noted is the a spiral fracture of the shaft of the fifth metatarsal with no significant displacement and no angulation.  No new injuries.  The patient has moderate arthritis of the ankle joint with dorsal spurring on the talus and navicular.   IMPRESSION: No acute abnormality.  The nondisplaced fracture of the shaft of the fifth metatarsal is unchanged since the study of 01/17/2012.   Original Report Authenticated By: Francene Boyers, M.D.      1. Displaced fracture of fifth metatarsal bone of left foot        MDM  Cam walker, crutches, ice, elevation. Ibuprofen rx-percocet F/u with dr. Hilda Lias prn        Evalina Field, PA 01/23/12 1210  Evalina Field, PA 01/29/12 2241

## 2012-01-23 NOTE — ED Notes (Signed)
Patient with no complaints at this time. Respirations even and unlabored. Skin warm/dry. Discharge instructions reviewed with patient at this time. Patient given opportunity to voice concerns/ask questions. Patient discharged at this time and left Emergency Department with steady gait.   

## 2012-01-30 NOTE — ED Provider Notes (Signed)
Medical screening examination/treatment/procedure(s) were performed by non-physician practitioner and as supervising physician I was immediately available for consultation/collaboration.   Shelda Jakes, MD 01/30/12 2351

## 2012-05-06 ENCOUNTER — Encounter (HOSPITAL_COMMUNITY): Payer: Self-pay

## 2012-05-06 ENCOUNTER — Emergency Department (HOSPITAL_COMMUNITY)
Admission: EM | Admit: 2012-05-06 | Discharge: 2012-05-06 | Disposition: A | Discharge status: Home / Self Care | Payer: Medicaid Other | Attending: Emergency Medicine | Admitting: Emergency Medicine

## 2012-05-06 DIAGNOSIS — Z8659 Personal history of other mental and behavioral disorders: Secondary | ICD-10-CM | POA: Insufficient documentation

## 2012-05-06 DIAGNOSIS — M545 Low back pain, unspecified: Secondary | ICD-10-CM | POA: Insufficient documentation

## 2012-05-06 DIAGNOSIS — Z8739 Personal history of other diseases of the musculoskeletal system and connective tissue: Secondary | ICD-10-CM | POA: Insufficient documentation

## 2012-05-06 DIAGNOSIS — Z8781 Personal history of (healed) traumatic fracture: Secondary | ICD-10-CM | POA: Insufficient documentation

## 2012-05-06 MED ORDER — HYDROMORPHONE HCL PF 1 MG/ML IJ SOLN
1.0000 mg | Freq: Once | INTRAMUSCULAR | Status: AC
  Administered 2012-05-06: 1 mg via INTRAMUSCULAR
  Filled 2012-05-06: qty 1

## 2012-05-06 MED ORDER — CYCLOBENZAPRINE HCL 10 MG PO TABS: 10.0000 mg | ORAL_TABLET | Freq: Two times a day (BID) | ORAL | Status: DC | PRN

## 2012-05-06 MED ORDER — HYDROCODONE-ACETAMINOPHEN 7.5-325 MG/15ML PO SOLN: 10.0000 mL | Freq: Four times a day (QID) | ORAL | Status: DC | PRN

## 2012-05-06 MED ORDER — CYCLOBENZAPRINE HCL 10 MG PO TABS
10.0000 mg | ORAL_TABLET | Freq: Once | ORAL | Status: AC
  Administered 2012-05-06: 10 mg via ORAL
  Filled 2012-05-06: qty 1

## 2012-05-06 MED ORDER — IBUPROFEN 800 MG PO TABS
800.0000 mg | ORAL_TABLET | Freq: Once | ORAL | Status: AC
  Administered 2012-05-06: 800 mg via ORAL
  Filled 2012-05-06: qty 1

## 2012-05-06 MED ORDER — IBUPROFEN 800 MG PO TABS: 800.0000 mg | ORAL_TABLET | Freq: Three times a day (TID) | ORAL | Status: DC

## 2012-05-06 NOTE — ED Provider Notes (Signed)
History     CSN: [id number]  Arrival date & time 05/06/12  0272   First MD Initiated Contact with Patient 05/06/12 0309      Chief Complaint  Patient presents with  . Back Pain    (Consider location/radiation/quality/duration/timing/severity/associated sxs/prior treatment) HPI Hx per PT - low back pain present for years, worse with movement and twisting/ bending. 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 No recalled trauma otherwise. No weakness, numbness or incontinence. H/o same, has had MRI in the past. Currently does not have any medications and is not taking anything. He broke his foot in Dec, has gained 30 pounds since that time and states he needs to lose wt and get back into exercising. Symptoms today are the same as his typical chronic pains, just more severe Past Medical History  Diagnosis Date  . Bipolar 1 disorder   . Attention deficit disorder   . Chronic pain   . AC (acromioclavicular) joint bone spurs   . Arthritis   . Degenerative disc disease, lumbar   . Spinal stenosis     Past Surgical History  Procedure Laterality Date  . Cystectomy      No family history on file.  History  Substance Use Topics  . Smoking status: Never Smoker   . Smokeless tobacco: Not on file  . Alcohol Use: No      Review of Systems  Constitutional: Negative for fever and chills.  HENT: Negative for neck pain and neck stiffness.   Eyes: Negative for pain.  Respiratory: Negative for shortness of breath.   Cardiovascular: Negative for chest pain.  Gastrointestinal: Negative for abdominal pain.  Genitourinary: Negative for dysuria.  Musculoskeletal: Positive for back pain.  Skin: Negative for rash.  Neurological: Negative for headaches.  All other systems reviewed and are negative.    Allergies  Review of patient's allergies indicates no known allergies.  Home Medications    Current Outpatient Rx  Name  Route  Sig  Dispense  Refill  . acetaminophen (TYLENOL) 500 MG tablet   Oral   Take 1,000 mg by mouth every 6 (six) hours as needed. For pain         . ibuprofen (ADVIL,MOTRIN) 200 MG tablet   Oral   Take 200 mg by mouth every 6 (six) hours as needed for pain.         Marland Kitchen oxyCODONE-acetaminophen (PERCOCET/ROXICET) 5-325 MG per tablet      One tab po q 6 hrs prn pain   15 tablet   0   . Soft Lens Products (SALINE SENSITIVE EYES) SOLN   Does not apply   1 drop by Does not apply route as needed. CONTACTS            BP 162/91  Pulse 78  Temp(Src) 98.3 F (36.8 C) (Oral)  Resp 16  Ht 5\' 11"  (1.803 m)  Wt 310 lb (140.615 kg)  BMI 43.26 kg/m2  SpO2 98%  Physical Exam  Constitutional: He is oriented to person, place, and time. He appears well-developed and well-nourished.  HENT:  Head: Normocephalic and atraumatic.  Eyes: EOM are normal. Pupils are equal, round, and reactive to light.  Neck: Neck supple.  Cardiovascular: Normal rate, regular rhythm and intact distal pulses.   Pulmonary/Chest: Effort normal and breath sounds normal. No respiratory distress.  Abdominal: Soft. Bowel sounds are normal.  Musculoskeletal:  Normal range of motion. He exhibits no edema.  Tender lower lumbar no midline deformity. No LED deficits. Equal DTRs, sensorium to light touch and strengths with dorsi/ plantar flexion, knee extension. Gait intact   Neurological: He is alert and oriented to person, place, and time.  Skin: Skin is warm and dry.    ED Course  Procedures (including critical care time)  Ice and medications provided  Outpatient referrals. Back pain precautions provided/ verbalized as understood.  MDM  LBP no red flags to suggest indication for emergent MRI  VS, previous records and nursing notes reviewed        Sunnie Nielsen, MD 05/06/12 [phone]

## 2012-05-06 NOTE — ED Notes (Signed)
Pt has had this back pain off and on for a long time,  States pain worse over past several days.

## 2012-05-20 ENCOUNTER — Emergency Department (HOSPITAL_COMMUNITY)
Admission: EM | Admit: 2012-05-20 | Discharge: 2012-05-20 | Disposition: A | Discharge status: Home / Self Care | Payer: Medicaid Other | Attending: Emergency Medicine | Admitting: Emergency Medicine

## 2012-05-20 ENCOUNTER — Encounter (HOSPITAL_COMMUNITY): Payer: Self-pay | Admitting: Emergency Medicine

## 2012-05-20 DIAGNOSIS — M5416 Radiculopathy, lumbar region: Secondary | ICD-10-CM

## 2012-05-20 DIAGNOSIS — Z8739 Personal history of other diseases of the musculoskeletal system and connective tissue: Secondary | ICD-10-CM | POA: Insufficient documentation

## 2012-05-20 DIAGNOSIS — IMO0002 Reserved for concepts with insufficient information to code with codable children: Secondary | ICD-10-CM | POA: Insufficient documentation

## 2012-05-20 DIAGNOSIS — R300 Dysuria: Secondary | ICD-10-CM | POA: Insufficient documentation

## 2012-05-20 DIAGNOSIS — M545 Low back pain, unspecified: Secondary | ICD-10-CM | POA: Insufficient documentation

## 2012-05-20 DIAGNOSIS — Z8659 Personal history of other mental and behavioral disorders: Secondary | ICD-10-CM | POA: Insufficient documentation

## 2012-05-20 DIAGNOSIS — R358 Other polyuria: Secondary | ICD-10-CM | POA: Insufficient documentation

## 2012-05-20 DIAGNOSIS — R209 Unspecified disturbances of skin sensation: Secondary | ICD-10-CM | POA: Insufficient documentation

## 2012-05-20 DIAGNOSIS — M6281 Muscle weakness (generalized): Secondary | ICD-10-CM | POA: Insufficient documentation

## 2012-05-20 DIAGNOSIS — R3589 Other polyuria: Secondary | ICD-10-CM | POA: Insufficient documentation

## 2012-05-20 DIAGNOSIS — G8929 Other chronic pain: Secondary | ICD-10-CM | POA: Insufficient documentation

## 2012-05-20 LAB — URINALYSIS, ROUTINE W REFLEX MICROSCOPIC
Leukocytes, UA: NEGATIVE
Protein, ur: NEGATIVE mg/dL
Specific Gravity, Urine: >1.03 — ABNORMAL HIGH (ref 1.005–1.030)
Urobilinogen, UA: 0.2 mg/dL (ref 0.0–1.0)

## 2012-05-20 LAB — GLUCOSE, CAPILLARY: Glucose-Capillary: 94 mg/dL (ref 70–99)

## 2012-05-20 MED ORDER — METHOCARBAMOL 500 MG PO TABS
500.0000 mg | ORAL_TABLET | Freq: Once | ORAL | Status: AC
  Administered 2012-05-20: 500 mg via ORAL
  Filled 2012-05-20: qty 1

## 2012-05-20 MED ORDER — KETOROLAC TROMETHAMINE 60 MG/2ML IM SOLN
60.0000 mg | Freq: Once | INTRAMUSCULAR | Status: AC
  Administered 2012-05-20: 60 mg via INTRAMUSCULAR
  Filled 2012-05-20: qty 2

## 2012-05-20 MED ORDER — OXYCODONE-ACETAMINOPHEN 5-325 MG PO TABS
1.0000 | ORAL_TABLET | Freq: Once | ORAL | Status: AC
  Administered 2012-05-20: 1 via ORAL
  Filled 2012-05-20: qty 1

## 2012-05-20 MED ORDER — HYDROCODONE-ACETAMINOPHEN 7.5-325 MG/15ML PO SOLN: 10.0000 mL | Freq: Four times a day (QID) | ORAL | Status: DC | PRN

## 2012-05-20 NOTE — ED Provider Notes (Signed)
History  This chart was scribed for Loren Racer, MD, by Candelaria Stagers, ED Scribe. This patient was seen in room [facility]11/[facility]11 and the patient's care was started at 8:01PM   CSN: [id number]  Arrival date & time 05/20/12  4098   First MD Initiated Contact with Patient 05/20/12 1951      Chief Complaint  Patient presents with  . Polyuria  . Testicle Pain     The history is provided by the patient. No language interpreter was used.   Walter [name] is a 47 y.o. male who presents to the Emergency Department complaining of increased urination and tingling to his right testicle that started about 9 days ago.  Pt has h/o lower back pain and reports his pain started radiating to his groin area one month ago.  He also reports increased bilateral leg weakness and tingling.  Pt denies dysuria or penile discharge.  Pt is sexually active and has no h/o STDs.  Nothing seems to make the sx better or worse.    Past Medical History  Diagnosis Date  . Bipolar 1 disorder   . Attention deficit disorder   . Chronic pain   . AC (acromioclavicular) joint bone spurs   . Arthritis   . Degenerative disc disease, lumbar   . Spinal stenosis     Past Surgical History  Procedure Laterality Date  . Cystectomy      History reviewed. No pertinent family history.  History  Substance Use Topics  . Smoking status: Never Smoker   . Smokeless tobacco: Not on file  . Alcohol Use: No      Review of Systems  Endocrine: Positive for polyuria.  Genitourinary: Positive for dysuria, discharge and testicular pain.  Musculoskeletal: Positive for back pain.  All other systems reviewed and are negative.    Allergies  Review of patient's allergies indicates no known allergies.  Home Medications   Current Outpatient Rx  Name  Route  Sig  Dispense  Refill  . acetaminophen (TYLENOL) 500 MG tablet   Oral   Take 1,000 mg by mouth every 6 (six) hours as needed. For pain         . cyclobenzaprine  (FLEXERIL) 10 MG tablet   Oral   Take 1 tablet (10 mg total) by mouth 2 (two) times daily as needed for muscle spasms.   20 tablet   0   . ibuprofen (ADVIL,MOTRIN) 200 MG tablet   Oral   Take 200 mg by mouth every 6 (six) hours as needed for pain.         Marland Kitchen ibuprofen (ADVIL,MOTRIN) 800 MG tablet   Oral   Take 1 tablet (800 mg total) by mouth 3 (three) times daily.   21 tablet   0   . Soft Lens Products (SALINE SENSITIVE EYES) SOLN   Does not apply   1 drop by Does not apply route as needed. CONTACTS          . HYDROcodone-acetaminophen (HYCET) 7.5-325 mg/15 ml solution   Oral   Take 10 mLs by mouth every 6 (six) hours as needed for pain.   60 mL   0     BP 178/92  Pulse 82  Temp(Src) 98.6 F (37 C) (Oral)  Resp 18  Ht 5\' 11"  (1.803 m)  Wt 320 lb (145.151 kg)  BMI 44.65 kg/m2  SpO2 98%  Physical Exam  Nursing note and vitals reviewed. Constitutional: He is oriented to person, place, and time. He appears  well-developed and well-nourished.  HENT:  Head: Normocephalic and atraumatic.  Mouth/Throat: Oropharynx is clear and moist.  Eyes: Conjunctivae and EOM are normal. Pupils are equal, round, and reactive to light.  Neck: Normal range of motion. Neck supple.  Cardiovascular: Normal rate, regular rhythm and normal heart sounds.   Pulmonary/Chest: Effort normal and breath sounds normal.  Abdominal: Soft. Bowel sounds are normal.  Genitourinary: No penile tenderness.  No inguinal hernia. No testicular pain. No penile d/c. +cremasteric reflex.   Musculoskeletal: Normal range of motion. He exhibits no tenderness.  Negative straight leg raise.  No saddle anesthesia.  sensation intact.   Neurological: He is alert and oriented to person, place, and time.  Skin: Skin is warm and dry.  Psychiatric: He has a normal mood and affect.    ED Course  Procedures   DIAGNOSTIC STUDIES: Oxygen Saturation is 98% on room air, normal by my interpretation.    COORDINATION OF  CARE:  8:09 PM Discussed course of care with pt which includes basic blood work and urinalysis.  Pt understands and agrees.    Labs Reviewed  URINALYSIS, ROUTINE W REFLEX MICROSCOPIC - Abnormal; Notable for the following:    Specific Gravity, Urine >1.030 (*)    All other components within normal limits  GLUCOSE, CAPILLARY   No results found.   1. Lumbar radiculopathy       MDM  I personally performed the services described in this documentation, which was scribed in my presence. The recorded information has been reviewed and is accurate.  No red flags. Chronic lumbar back pain. Pt advised to f/u with chronic pain management.       Loren Racer, MD 05/20/12 2102

## 2012-05-20 NOTE — ED Notes (Signed)
Went to discharge pt and pt states his pain is not relieved at all and is upset that we are discharging him while he is still in so much pain.  Dr Ranae Palms in to speak with pt and explain about rx meds that are prescribed and also follow up.  Pt agreeable at this time and is calling for his ride so we can possibly give him a pain pill before he goes home.

## 2012-05-20 NOTE — ED Notes (Signed)
Pt c/o polyuria, lower back pain, and rt testicle tingling since the 20th

## 2012-07-16 ENCOUNTER — Ambulatory Visit: Payer: Self-pay | Admitting: Family Medicine

## 2012-07-22 ENCOUNTER — Ambulatory Visit (INDEPENDENT_AMBULATORY_CARE_PROVIDER_SITE_OTHER): Payer: Medicaid Other | Admitting: Family Medicine

## 2012-07-22 ENCOUNTER — Ambulatory Visit (INDEPENDENT_AMBULATORY_CARE_PROVIDER_SITE_OTHER): Payer: Medicaid Other

## 2012-07-22 ENCOUNTER — Encounter: Payer: Self-pay | Admitting: Family Medicine

## 2012-07-22 VITALS — BP 142/81 | HR 60 | Temp 98.4°F | Ht 71.0 in | Wt 336.2 lb

## 2012-07-22 DIAGNOSIS — M549 Dorsalgia, unspecified: Secondary | ICD-10-CM | POA: Insufficient documentation

## 2012-07-22 DIAGNOSIS — M1990 Unspecified osteoarthritis, unspecified site: Secondary | ICD-10-CM

## 2012-07-22 DIAGNOSIS — M129 Arthropathy, unspecified: Secondary | ICD-10-CM

## 2012-07-22 MED ORDER — MELOXICAM 15 MG PO TABS: 15.0000 mg | ORAL_TABLET | Freq: Every day | ORAL | Status: DC

## 2012-07-22 NOTE — Progress Notes (Signed)
  Subjective:    Patient ID: Walter [name], male    DOB: March 27, 1965, 47 y.o.   MRN: [id number]  HPI  This 47 y.o. male presents for evaluation of  Back pain.  He c/o back pain for over the last few years.  He states the back pain is radiating into his legs and he c/o worsening sx's with walking.  He states he has had MRI of LS spine in 2012 which showed bulging disk, spondylosis, impingement, and stenosis.  He reports difficulty doing the basic activities that he usually does around the house etc.  He c/o numbness in his wrists and arthritis sx's.     Review of Systems    No chest pain, SOB, HA, dizziness, vision change, N/V, diarrhea, constipation, dysuria, urinary urgency or frequency, myalgias or rash.  Objective:   Physical Exam Vital signs noted  Obese male in NAD.  HEENT - Head atraumatic Normocephalic                Eyes - PERRLA, Conjuctiva - clear Sclera- Clear EOMI                Ears - EAC's Wnl TM's Wnl Gross Hearing WNL                Nose - Nares patent                 Throat - oropharanx wnl Respiratory - Lungs CTA bilateral Cardiac - RRR S1 and S2 without murmur MS - TTP bilateral LS spine, negative SLR, dorsiflexion and plantar flexion good, Normal strength bilateral lower extremities.  Gait wnl.  Biltateral fingers with synovial thickening.      Assessment & Plan:  Arthritis - Plan: meloxicam (MOBIC) 15 MG tablet  Back pain - Plan: Ambulatory referral to Orthopedic Surgery, DG Lumbar Spine Complete Rx mobic for back and arthritis.

## 2012-07-22 NOTE — Patient Instructions (Addendum)
Back Pain, Adult  Low back pain is very common. About 1 in 5 people have back pain. The cause of low back pain is rarely dangerous. The pain often gets better over time. About half of people with a sudden onset of back pain feel better in just 2 weeks. About 8 in 10 people feel better by 6 weeks.   CAUSES  Some common causes of back pain include:  · Strain of the muscles or ligaments supporting the spine.  · Wear and tear (degeneration) of the spinal discs.  · Arthritis.  · Direct injury to the back.  DIAGNOSIS  Most of the time, the direct cause of low back pain is not known. However, back pain can be treated effectively even when the exact cause of the pain is unknown. Answering your caregiver's questions about your overall health and symptoms is one of the most accurate ways to make sure the cause of your pain is not dangerous. If your caregiver needs more information, he or she may order lab work or imaging tests (X-rays or MRIs). However, even if imaging tests show changes in your back, this usually does not require surgery.  HOME CARE INSTRUCTIONS  For many people, back pain returns. Since low back pain is rarely dangerous, it is often a condition that people can learn to manage on their own.   · Remain active. It is stressful on the back to sit or stand in one place. Do not sit, drive, or stand in one place for more than 30 minutes at a time. Take short walks on level surfaces as soon as pain allows. Try to increase the length of time you walk each day.  · Do not stay in bed. Resting more than 1 or 2 days can delay your recovery.  · Do not avoid exercise or work. Your body is made to move. It is not dangerous to be active, even though your back may hurt. Your back will likely heal faster if you return to being active before your pain is gone.  · Pay attention to your body when you  bend and lift. Many people have less discomfort when lifting if they bend their knees, keep the load close to their bodies, and  avoid twisting. Often, the most comfortable positions are those that put less stress on your recovering back.  · Find a comfortable position to sleep. Use a firm mattress and lie on your side with your knees slightly bent. If you lie on your back, put a pillow under your knees.  · Only take over-the-counter or prescription medicines as directed by your caregiver. Over-the-counter medicines to reduce pain and inflammation are often the most helpful. Your caregiver may prescribe muscle relaxant drugs. These medicines help dull your pain so you can more quickly return to your normal activities and healthy exercise.  · Put ice on the injured area.  · Put ice in a plastic bag.  · Place a towel between your skin and the bag.  · Leave the ice on for 15-20 minutes, 3-4 times a day for the first 2 to 3 days. After that, ice and heat may be alternated to reduce pain and spasms.  · Ask your caregiver about trying back exercises and gentle massage. This may be of some benefit.  · Avoid feeling anxious or stressed. Stress increases muscle tension and can worsen back pain. It is important to recognize when you are anxious or stressed and learn ways to manage it. Exercise is a great option.  SEEK MEDICAL CARE IF:  · You have pain that is not relieved with rest or   medicine.  · You have pain that does not improve in 1 week.  · You have new symptoms.  · You are generally not feeling well.  SEEK IMMEDIATE MEDICAL CARE IF:   · You have pain that radiates from your back into your legs.  · You develop new bowel or bladder control problems.  · You have unusual weakness or numbness in your arms or legs.  · You develop nausea or vomiting.  · You develop abdominal pain.  · You feel faint.  Document Released: 01/08/2005 Document Revised: 07/10/2011 Document Reviewed: 05/29/2010  [employer]® Patient Information ©2014 [employer].

## 2012-08-05 ENCOUNTER — Encounter: Payer: Medicaid Other | Admitting: Family Medicine

## 2012-08-26 ENCOUNTER — Encounter: Payer: Medicaid Other | Admitting: Family Medicine

## 2012-09-04 ENCOUNTER — Encounter: Payer: Medicaid Other | Admitting: Family Medicine

## 2012-11-27 ENCOUNTER — Other Ambulatory Visit: Payer: Self-pay

## 2013-02-16 ENCOUNTER — Ambulatory Visit (INDEPENDENT_AMBULATORY_CARE_PROVIDER_SITE_OTHER): Payer: Medicaid Other | Admitting: General Practice

## 2013-02-16 ENCOUNTER — Encounter: Payer: Self-pay | Admitting: General Practice

## 2013-02-16 VITALS — BP 154/86 | HR 64 | Temp 98.0°F | Ht 71.0 in | Wt 354.0 lb

## 2013-02-16 DIAGNOSIS — M25529 Pain in unspecified elbow: Secondary | ICD-10-CM

## 2013-02-16 NOTE — Progress Notes (Signed)
   Subjective:    Patient ID: Walter [name], male    DOB: [birth year], 48 y.o.   MRN: [id number]  Arm Pain  The incident occurred more than 1 week ago (onset 3 months ago). There was no injury mechanism. The pain is present in the right elbow and left elbow. The quality of the pain is described as aching and burning. The pain radiates to the left arm and right arm. The pain is at a severity of 8/10. The pain has been intermittent since the incident. Pertinent negatives include no chest pain or muscle weakness. Nothing aggravates the symptoms. He has tried elevation, heat and NSAIDs for the symptoms. The treatment provided no relief.  Reports having an ortho referral in the past, but was unable to make appointment due to insurance. He was diagnosed with arthritic changes in July 2014. She reports being a patient at methadone clinic at this time.     Review of Systems  Constitutional: Negative for fever and chills.  Respiratory: Negative for chest tightness and shortness of breath.   Cardiovascular: Negative for chest pain and palpitations.  Musculoskeletal:       Bilateral elbow pain  Neurological: Negative for dizziness, weakness and headaches.       Objective:   Physical Exam  Constitutional: He is oriented to person, place, and time. He appears well-developed and well-nourished.  Morbid obesity  Cardiovascular: Normal rate, regular rhythm and normal heart sounds.   Pulmonary/Chest: Effort normal and breath sounds normal. No respiratory distress. He exhibits no tenderness.  Musculoskeletal: He exhibits tenderness.  Neurological: He is alert and oriented to person, place, and time.  Skin: Skin is warm and dry.  Psychiatric: He has a normal mood and affect.          Assessment & Plan:  1. Joint pain, elbow - Uric acid - Arthritis Panel - Ambulatory referral to Orthopedic Surgery -discussed ice and rest of affected areas -RTO if symptoms worsen or unresolved Patient  verbalized understanding Coralie KeensMae E. Orvel Cutsforth, FNP-C

## 2013-02-16 NOTE — Patient Instructions (Addendum)
Myalgia, Adult °Myalgia is the medical term for muscle pain. It is a symptom of many things. Nearly everyone at some time in their life has this. The most common cause for muscle pain is overuse or straining and more so when you are not in shape. Injuries and muscle bruises cause myalgias. Muscle pain without a history of injury can also be caused by a virus. It frequently comes along with the flu. Myalgia not caused by muscle strain can be present in a large number of infectious diseases. Some autoimmune diseases like lupus and fibromyalgia can cause muscle pain. Myalgia may be mild, or severe. °SYMPTOMS  °The symptoms of myalgia are simply muscle pain. Most of the time this is short lived and the pain goes away without treatment. °DIAGNOSIS  °Myalgia is diagnosed by your caregiver by taking your history. This means you tell him when the problems began, what they are, and what has been happening. If this has not been a long term problem, your caregiver may want to watch for a while to see what will happen. If it has been long term, they may want to do additional testing. °TREATMENT  °The treatment depends on what the underlying cause of the muscle pain is. Often anti-inflammatory medications will help. °HOME CARE INSTRUCTIONS °· If the pain in your muscles came from overuse, slow down your activities until the problems go away. °· Myalgia from overuse of a muscle can be treated with alternating hot and cold packs on the muscle affected or with cold for the first couple days. If either heat or cold seems to make things worse, stop their use. °· Apply ice to the sore area for 15-20 minutes, 03-04 times per day, while awake for the first 2 days of muscle soreness, or as directed. Put the ice in a plastic bag and place a towel between the bag of ice and your skin. °· Only take over-the-counter or prescription medicines for pain, discomfort, or fever as directed by your caregiver. °· Regular gentle exercise may help if  you are not active. °· Stretching before strenuous exercise can help lower the risk of myalgia. It is normal when beginning an exercise regimen to feel some muscle pain after exercising. Muscles that have not been used frequently will be sore at first. If the pain is extreme, this may mean injury to a muscle. °SEEK MEDICAL CARE IF: °· You have an increase in muscle pain that is not relieved with medication. °· You begin to run a temperature. °· You develop nausea and vomiting. °· You develop a stiff and painful neck. °· You develop a rash. °· You develop muscle pain after a tick bite. °· You have continued muscle pain while working out even after you are in good condition. °SEEK IMMEDIATE MEDICAL CARE IF: °Any of your problems are getting worse and medications are not helping. °MAKE SURE YOU:  °· Understand these instructions. °· Will watch your condition. °· Will get help right away if you are not doing well or get worse. °Document Released: 11/30/2005 Document Revised: 04/02/2011 Document Reviewed: 02/19/2006 °ExitCare® Patient Information ©2014 [employer]. ° °

## 2013-02-17 LAB — ARTHRITIS PANEL
Basophils Absolute: 0 x10E3/uL (ref 0.0–0.2)
Basos: 1 %
Eos: 4 %
Eosinophils Absolute: 0.3 x10E3/uL (ref 0.0–0.4)
HCT: 38.8 % (ref 37.5–51.0)
Hemoglobin: 12.9 g/dL (ref 12.6–17.7)
Immature Grans (Abs): 0 x10E3/uL (ref 0.0–0.1)
Immature Granulocytes: 0 %
Lymphocytes Absolute: 1.8 x10E3/uL (ref 0.7–3.1)
Lymphs: 31 %
MCH: 29.1 pg (ref 26.6–33.0)
MCHC: 33.2 g/dL (ref 31.5–35.7)
MCV: 88 fL (ref 79–97)
Monocytes Absolute: 0.5 x10E3/uL (ref 0.1–0.9)
Monocytes: 9 %
Neutrophils Absolute: 3.2 x10E3/uL (ref 1.4–7.0)
Neutrophils Relative %: 55 %
Platelets: 196 x10E3/uL (ref 150–379)
RBC: 4.43 x10E6/uL (ref 4.14–5.80)
RDW: 13.9 % (ref 12.3–15.4)
Rheumatoid fact SerPl-aCnc: 10 [IU]/mL (ref 0.0–13.9)
Sed Rate: 20 mm/h — ABNORMAL HIGH (ref 0–15)
Uric Acid: 5.4 mg/dL (ref 3.7–8.6)
WBC: 5.9 x10E3/uL (ref 3.4–10.8)

## 2013-08-24 ENCOUNTER — Ambulatory Visit: Payer: Medicaid Other | Admitting: Family

## 2013-12-21 ENCOUNTER — Telehealth: Payer: Self-pay | Admitting: Family Medicine

## 2013-12-21 NOTE — Telephone Encounter (Signed)
Pt given appt for 12/7 with bill oxford @ 8

## 2013-12-28 ENCOUNTER — Ambulatory Visit: Payer: Medicaid Other | Admitting: Family Medicine

## 2014-07-09 ENCOUNTER — Ambulatory Visit (INDEPENDENT_AMBULATORY_CARE_PROVIDER_SITE_OTHER): Payer: Medicaid Other | Admitting: Family Medicine

## 2014-07-09 ENCOUNTER — Encounter: Payer: Self-pay | Admitting: Family Medicine

## 2014-07-09 DIAGNOSIS — R030 Elevated blood-pressure reading, without diagnosis of hypertension: Secondary | ICD-10-CM

## 2014-07-09 DIAGNOSIS — IMO0001 Reserved for inherently not codable concepts without codable children: Secondary | ICD-10-CM

## 2014-07-09 MED ORDER — LISINOPRIL-HYDROCHLOROTHIAZIDE 10-12.5 MG PO TABS: 1.0000 | ORAL_TABLET | Freq: Every day | ORAL | Status: DC

## 2014-07-09 NOTE — Addendum Note (Signed)
Addended by: Gwenith Daily on: 07/09/2014 12:00 PM   Modules accepted: Orders

## 2014-07-09 NOTE — Progress Notes (Signed)
   Subjective:    Patient ID: Walter [name], male    DOB: 1965/02/27, 49 y.o.   MRN: [id number]  HPI 49 year old gentleman here with hypertension. He was told of the clinic recently his pressure was 180/99. He has no symptoms such as dizziness headache chest pain. He denies family history. His weight has gone up recently not watching his diet and not exercising. It is now over 400 pounds.  Patient Active Problem List   Diagnosis Date Noted  . Back pain 07/22/2012   Outpatient Encounter Prescriptions as of 07/09/2014  Medication Sig  . METHADONE HCL PO Take 104 mg by mouth daily.   . [DISCONTINUED] meloxicam (MOBIC) 15 MG tablet Take 1 tablet (15 mg total) by mouth daily.   No facility-administered encounter medications on file as of 07/09/2014.      Review of Systems  Constitutional: Positive for unexpected weight change.  Respiratory: Negative.   Cardiovascular: Positive for leg swelling.  Musculoskeletal: Positive for back pain.  Neurological: Negative.   Psychiatric/Behavioral: Negative.        Objective:   Physical Exam  Constitutional: He is oriented to person, place, and time.  Grossly overweight  Cardiovascular: Normal rate, regular rhythm and intact distal pulses.   Pulmonary/Chest: Effort normal and breath sounds normal.  Neurological: He is alert and oriented to person, place, and time.  Psychiatric: He has a normal mood and affect.    BP 152/88 mmHg  Pulse 68  Temp(Src) 97.7 F (36.5 C) (Oral)  Ht 5\' 11"  (1.803 m)  Wt 408 lb (185.068 kg)  BMI 56.93 kg/m2        Assessment & Plan:  1. Severe obesity (BMI >= 40) Discussion about weight loss. Patient knows how to do it but has not had discipline recently. He promises to work on that  2. Elevated blood pressure We'll go ahead and begin anti-hypertensive with diabetic since he has dependent edema. Given information about blood pressure. See him in one month for follow-up in the meantime will get labs to  look at cholesterol and glucose  Frederica Kuster MD

## 2014-07-09 NOTE — Patient Instructions (Signed)

## 2014-08-06 ENCOUNTER — Ambulatory Visit: Payer: Medicaid Other | Admitting: Family Medicine

## 2014-08-09 ENCOUNTER — Encounter: Payer: Self-pay | Admitting: Family Medicine

## 2015-06-10 ENCOUNTER — Encounter: Payer: Self-pay | Admitting: Family

## 2015-06-10 ENCOUNTER — Ambulatory Visit (INDEPENDENT_AMBULATORY_CARE_PROVIDER_SITE_OTHER): Payer: Medicaid Other | Admitting: Family

## 2015-06-10 VITALS — BP 141/91 | HR 87 | Temp 97.3°F | Ht 71.0 in | Wt >= 6400 oz

## 2015-06-10 DIAGNOSIS — M545 Low back pain, unspecified: Secondary | ICD-10-CM | POA: Insufficient documentation

## 2015-06-10 DIAGNOSIS — Z6841 Body Mass Index (BMI) 40.0 and over, adult: Secondary | ICD-10-CM | POA: Diagnosis not present

## 2015-06-10 DIAGNOSIS — Z713 Dietary counseling and surveillance: Secondary | ICD-10-CM | POA: Diagnosis not present

## 2015-06-10 DIAGNOSIS — G8929 Other chronic pain: Secondary | ICD-10-CM | POA: Insufficient documentation

## 2015-06-10 MED ORDER — PHENTERMINE HCL 37.5 MG PO CAPS: 37.5000 mg | ORAL_CAPSULE | ORAL | Status: DC

## 2015-06-10 NOTE — Patient Instructions (Signed)
Exercising to Lose Weight Exercising can help you to lose weight. In order to lose weight through exercise, you need to do vigorous-intensity exercise. You can tell that you are exercising with vigorous intensity if you are breathing very hard and fast and cannot hold a conversation while exercising. Moderate-intensity exercise helps to maintain your current weight. You can tell that you are exercising at a moderate level if you have a higher heart rate and faster breathing, but you are still able to hold a conversation. HOW OFTEN SHOULD I EXERCISE? Choose an activity that you enjoy and set realistic goals. Your health care provider can help you to make an activity plan that works for you. Exercise regularly as directed by your health care provider. This may include:  Doing resistance training twice each week, such as:  Push-ups.  Sit-ups.  Lifting weights.  Using resistance bands.  Doing a given intensity of exercise for a given amount of time. Choose from these options:  150 minutes of moderate-intensity exercise every week.  75 minutes of vigorous-intensity exercise every week.  A mix of moderate-intensity and vigorous-intensity exercise every week. Children, pregnant women, people who are out of shape, people who are overweight, and older adults may need to consult a health care provider for individual recommendations. If you have any sort of medical condition, be sure to consult your health care provider before starting a new exercise program. WHAT ARE SOME ACTIVITIES THAT CAN HELP ME TO LOSE WEIGHT?   Walking at a rate of at least 4.5 miles an hour.  Jogging or running at a rate of 5 miles per hour.  Biking at a rate of at least 10 miles per hour.  Lap swimming.  Roller-skating or in-line skating.  Cross-country skiing.  Vigorous competitive sports, such as football, basketball, and soccer.  Jumping rope.  Aerobic dancing. HOW CAN I BE MORE ACTIVE IN MY DAY-TO-DAY  ACTIVITIES?  Use the stairs instead of the elevator.  Take a walk during your lunch break.  If you drive, park your car farther away from work or school.  If you take public transportation, get off one stop early and walk the rest of the way.  Make all of your phone calls while standing up and walking around.  Get up, stretch, and walk around every 30 minutes throughout the day. WHAT GUIDELINES SHOULD I FOLLOW WHILE EXERCISING?  Do not exercise so much that you hurt yourself, feel dizzy, or get very short of breath.  Consult your health care provider prior to starting a new exercise program.  Wear comfortable clothes and shoes with good support.  Drink plenty of water while you exercise to prevent dehydration or heat stroke. Body water is lost during exercise and must be replaced.  Work out until you breathe faster and your heart beats faster.   This information is not intended to replace advice given to you by your health care provider. Make sure you discuss any questions you have with your health care provider.   Document Released: 02/10/2010 Document Revised: 01/29/2014 Document Reviewed: 06/11/2013 Elsevier Interactive Patient Education 2016 [employer] Calorie Counting for Weight Loss Calories are energy you get from the things you eat and drink. Your body uses this energy to keep you going throughout the day. The number of calories you eat affects your weight. When you eat more calories than your body needs, your body stores the extra calories as fat. When you eat fewer calories than your body needs, your body burns   fat to get the energy it needs. Calorie counting means keeping track of how many calories you eat and drink each day. If you make sure to eat fewer calories than your body needs, you should lose weight. In order for calorie counting to work, you will need to eat the number of calories that are right for you in a day to lose a healthy amount of weight per week. A  healthy amount of weight to lose per week is usually 1-2 lb (0.5-0.9 kg). A dietitian can determine how many calories you need in a day and give you suggestions on how to reach your calorie goal.  WHAT IS MY MY PLAN? My goal is to have __________ calories per day.  If I have this many calories per day, I should lose around __________ pounds per week. WHAT DO I NEED TO KNOW ABOUT CALORIE COUNTING? In order to meet your daily calorie goal, you will need to:  Find out how many calories are in each food you would like to eat. Try to do this before you eat.  Decide how much of the food you can eat.  Write down what you ate and how many calories it had. Doing this is called keeping a food log. WHERE DO I FIND CALORIE INFORMATION? The number of calories in a food can be found on a Nutrition Facts label. Note that all the information on a label is based on a specific serving of the food. If a food does not have a Nutrition Facts label, try to look up the calories online or ask your dietitian for help. HOW DO I DECIDE HOW MUCH TO EAT? To decide how much of the food you can eat, you will need to consider both the number of calories in one serving and the size of one serving. This information can be found on the Nutrition Facts label. If a food does not have a Nutrition Facts label, look up the information online or ask your dietitian for help. Remember that calories are listed per serving. If you choose to have more than one serving of a food, you will have to multiply the calories per serving by the amount of servings you plan to eat. For example, the label on a package of bread might say that a serving size is 1 slice and that there are 90 calories in a serving. If you eat 1 slice, you will have eaten 90 calories. If you eat 2 slices, you will have eaten 180 calories. HOW DO I KEEP A FOOD LOG? After each meal, record the following information in your food log:  What you ate.  How much of it you  ate.  How many calories it had.  Then, add up your calories. Keep your food log near you, such as in a small notebook in your pocket. Another option is to use a mobile app or website. Some programs will calculate calories for you and show you how many calories you have left each time you add an item to the log. WHAT ARE SOME CALORIE COUNTING TIPS?  Use your calories on foods and drinks that will fill you up and not leave you hungry. Some examples of this include foods like nuts and nut butters, vegetables, lean proteins, and high-fiber foods (more than 5 g fiber per serving).  Eat nutritious foods and avoid empty calories. Empty calories are calories you get from foods or beverages that do not have many nutrients, such as candy and soda. It   is better to have a nutritious high-calorie food (such as an avocado) than a food with few nutrients (such as a bag of chips).  Know how many calories are in the foods you eat most often. This way, you do not have to look up how many calories they have each time you eat them.  Look out for foods that may seem like low-calorie foods but are really high-calorie foods, such as baked goods, soda, and fat-free candy.  Pay attention to calories in drinks. Drinks such as sodas, specialty coffee drinks, alcohol, and juices have a lot of calories yet do not fill you up. Choose low-calorie drinks like water and diet drinks.  Focus your calorie counting efforts on higher calorie items. Logging the calories in a garden salad that contains only vegetables is less important than calculating the calories in a milk shake.  Find a way of tracking calories that works for you. Get creative. Most people who are successful find ways to keep track of how much they eat in a day, even if they do not count every calorie. WHAT ARE SOME PORTION CONTROL TIPS?  Know how many calories are in a serving. This will help you know how many servings of a certain food you can have.  Use a  measuring cup to measure serving sizes. This is helpful when you start out. With time, you will be able to estimate serving sizes for some foods.  Take some time to put servings of different foods on your favorite plates, bowls, and cups so you know what a serving looks like.  Try not to eat straight from a bag or box. Doing this can lead to overeating. Put the amount you would like to eat in a cup or on a plate to make sure you are eating the right portion.  Use smaller plates, glasses, and bowls to prevent overeating. This is a quick and easy way to practice portion control. If your plate is smaller, less food can fit on it.  Try not to multitask while eating, such as watching TV or using your computer. If it is time to eat, sit down at a table and enjoy your food. Doing this will help you to start recognizing when you are full. It will also make you more aware of what and how much you are eating. HOW CAN I CALORIE COUNT WHEN EATING OUT?  Ask for smaller portion sizes or child-sized portions.  Consider sharing an entree and sides instead of getting your own entree.  If you get your own entree, eat only half. Ask for a box at the beginning of your meal and put the rest of your entree in it so you are not tempted to eat it.  Look for the calories on the menu. If calories are listed, choose the lower calorie options.  Choose dishes that include vegetables, fruits, whole grains, low-fat dairy products, and lean protein. Focusing on smart food choices from each of the 5 food groups can help you stay on track at restaurants.  Choose items that are boiled, broiled, grilled, or steamed.  Choose water, milk, unsweetened iced tea, or other drinks without added sugars. If you want an alcoholic beverage, choose a lower calorie option. For example, a regular margarita can have up to 700 calories and a glass of wine has around 150.  Stay away from items that are buttered, battered, fried, or served with  cream sauce. Items labeled "crispy" are usually fried, unless stated otherwise.    Ask for dressings, sauces, and syrups on the side. These are usually very high in calories, so do not eat much of them.  Watch out for salads. Many people think salads are a healthy option, but this is often not the case. Many salads come with bacon, fried chicken, lots of cheese, fried chips, and dressing. All of these items have a lot of calories. If you want a salad, choose a garden salad and ask for grilled meats or steak. Ask for the dressing on the side, or ask for olive oil and vinegar or lemon to use as dressing.  Estimate how many servings of a food you are given. For example, a serving of cooked rice is  cup or about the size of half a tennis ball or one cupcake wrapper. Knowing serving sizes will help you be aware of how much food you are eating at restaurants. The list below tells you how big or small some common portion sizes are based on everyday objects.  1 oz--4 stacked dice.  3 oz--1 deck of cards.  1 tsp--1 dice.  1 Tbsp-- a Ping-Pong ball.  2 Tbsp--1 Ping-Pong ball.   cup--1 tennis ball or 1 cupcake wrapper.  1 cup--1 baseball.   This information is not intended to replace advice given to you by your health care provider. Make sure you discuss any questions you have with your health care provider.   Document Released: 01/08/2005 Document Revised: 01/29/2014 Document Reviewed: 11/13/2012 Elsevier Interactive Patient Education 2016 Elsevier Inc.  

## 2015-06-10 NOTE — Progress Notes (Signed)
   Subjective:    Patient ID: Walter [name], male    DOB: 1965-11-10, 50 y.o.   MRN: [id number]  PT presents to the office today with complaints is chronic low back pain. Pt states over the last 2 years he has gained 140 lbs.  Back Pain This is a chronic problem. The current episode started more than 1 month ago. The problem occurs constantly. The problem has been gradually worsening since onset. The pain is present in the lumbar spine. The pain is at a severity of 9/10. The pain is moderate. The symptoms are aggravated by bending, standing and twisting. Associated symptoms include numbness, tingling and weakness. Pertinent negatives include no bladder incontinence, bowel incontinence or leg pain. He has tried bed rest and NSAIDs for the symptoms. The treatment provided mild relief.      Review of Systems  Gastrointestinal: Negative for bowel incontinence.  Genitourinary: Positive for enuresis. Negative for bladder incontinence.  Musculoskeletal: Positive for myalgias, back pain and arthralgias.  Neurological: Positive for tingling, weakness and numbness.  All other systems reviewed and are negative.      Objective:   Physical Exam  Constitutional: He is oriented to person, place, and time. He appears well-developed and well-nourished. No distress.  Morbid obese   HENT:  Head: Normocephalic.  Right Ear: External ear normal.  Left Ear: External ear normal.  Nose: Nose normal.  Mouth/Throat: Oropharynx is clear and moist.  Eyes: Pupils are equal, round, and reactive to light. Right eye exhibits no discharge. Left eye exhibits no discharge.  Neck: Normal range of motion. Neck supple. No thyromegaly present.  Cardiovascular: Normal rate, regular rhythm, normal heart sounds and intact distal pulses.   No murmur heard. Pulmonary/Chest: Effort normal and breath sounds normal. No respiratory distress. He has no wheezes.  Abdominal: Soft. Bowel sounds are normal. He exhibits no  distension. There is no tenderness.  Musculoskeletal: Normal range of motion. He exhibits edema (trace in bilateral feet). He exhibits no tenderness.  Neurological: He is alert and oriented to person, place, and time. He has normal reflexes. No cranial nerve deficit.  Skin: Skin is warm and dry. No rash noted. No erythema.  Psychiatric: He has a normal mood and affect. His behavior is normal. Judgment and thought content normal.  Vitals reviewed.     BP 141/91 mmHg  Pulse 87  Temp(Src) 97.3 F (36.3 C) (Oral)  Ht '5\' 11"'$  (1.803 m)  Wt 407 lb (184.614 kg)  BMI 56.79 kg/m2     Assessment & Plan:  1. Chronic low back pain - Ambulatory referral to Orthopedic Surgery - CMP14+EGFR  2. Morbid obesity with BMI of 50.0-59.9, adult ([place]) - Ambulatory referral to General Surgery - CMP14+EGFR  3. Weight loss counseling, encounter for - Ambulatory referral to General Surgery - phentermine 37.5 MG capsule; Take 1 capsule (37.5 mg total) by mouth every morning.  Dispense: 30 capsule; Refill: 3 - CMP14+EGFR  Weight loss discussed in length- PT to change diet and exercise and follow up with General surgery for possible bariatric surgery. Pt to start phentermine.  Labs pending Ortho referral for chronic back pain- PT on methadone and goes to the [facility]   RTO in 3 months  Evelina Dun, [place]

## 2015-06-11 LAB — CMP14+EGFR
A/G RATIO: 1.8 (ref 1.2–2.2)
ALK PHOS: 71 IU/L (ref 39–117)
ALT: 12 IU/L (ref 0–44)
AST: 17 IU/L (ref 0–40)
Albumin: 4.3 g/dL (ref 3.5–5.5)
BUN/Creatinine Ratio: 11 (ref 9–20)
BUN: 8 mg/dL (ref 6–24)
Bilirubin Total: 0.2 mg/dL (ref 0.0–1.2)
CALCIUM: 9.1 mg/dL (ref 8.7–10.2)
CO2: 28 mmol/L (ref 18–29)
Chloride: 96 mmol/L (ref 96–106)
Creatinine, Ser: 0.7 mg/dL — ABNORMAL LOW (ref 0.76–1.27)
GFR calc Af Amer: 129 mL/min/{1.73_m2} (ref >59)
GFR, EST NON AFRICAN AMERICAN: 112 mL/min/{1.73_m2} (ref >59)
Globulin, Total: 2.4 g/dL (ref 1.5–4.5)
Glucose: 97 mg/dL (ref 65–99)
POTASSIUM: 4.1 mmol/L (ref 3.5–5.2)
SODIUM: 140 mmol/L (ref 134–144)
Total Protein: 6.7 g/dL (ref 6.0–8.5)

## 2015-09-30 ENCOUNTER — Ambulatory Visit: Payer: Medicaid Other | Admitting: Family

## 2015-10-14 ENCOUNTER — Ambulatory Visit: Payer: Medicaid Other | Admitting: Family

## 2015-10-17 ENCOUNTER — Encounter: Payer: Self-pay | Admitting: Family Medicine

## 2015-12-30 ENCOUNTER — Ambulatory Visit: Payer: Medicaid Other | Admitting: Family

## 2016-01-09 ENCOUNTER — Encounter: Payer: Self-pay | Admitting: Family

## 2016-01-09 ENCOUNTER — Ambulatory Visit (INDEPENDENT_AMBULATORY_CARE_PROVIDER_SITE_OTHER): Payer: Medicaid Other | Admitting: Family

## 2016-01-09 VITALS — BP 157/88 | HR 84 | Temp 97.5°F | Ht 71.0 in | Wt 393.2 lb

## 2016-01-09 DIAGNOSIS — N529 Male erectile dysfunction, unspecified: Secondary | ICD-10-CM | POA: Diagnosis not present

## 2016-01-09 DIAGNOSIS — F520 Hypoactive sexual desire disorder: Secondary | ICD-10-CM | POA: Diagnosis not present

## 2016-01-09 DIAGNOSIS — Z6841 Body Mass Index (BMI) 40.0 and over, adult: Secondary | ICD-10-CM | POA: Diagnosis not present

## 2016-01-09 DIAGNOSIS — I10 Essential (primary) hypertension: Secondary | ICD-10-CM

## 2016-01-09 MED ORDER — LISINOPRIL-HYDROCHLOROTHIAZIDE 10-12.5 MG PO TABS: 1.0000 | ORAL_TABLET | Freq: Every day | ORAL | 1 refills | Status: DC

## 2016-01-09 NOTE — Progress Notes (Signed)
   Subjective:    Patient ID: Walter [name], male    DOB: [birth year], 50 y.o.   MRN: [id number]  HPI Pt presents to the office today to discuss testosterone levels. PT states about a year ago he started having nipple sensitivity and started having sexual dysfunction. PT states he has not been able to get an erection in the last year. Pt states he has tried [place] with mild relief. PT states he seems more emotional and decreased sexual desire. Pt is currently on methadone and states he believes this is causing some of his sexually dysfunction.   Pt's BP is elevated today. Pt has not taken BP medication in months.   Review of Systems  Psychiatric/Behavioral: Positive for behavioral problems.  All other systems reviewed and are negative.      Objective:   Physical Exam  Constitutional: He is oriented to person, place, and time. He appears well-developed and well-nourished. No distress.  Morbid obese   HENT:  Head: Normocephalic.  Eyes: Pupils are equal, round, and reactive to light. Right eye exhibits no discharge. Left eye exhibits no discharge.  Neck: Normal range of motion. Neck supple. No thyromegaly present.  Cardiovascular: Normal rate, regular rhythm, normal heart sounds and intact distal pulses.   No murmur heard. Pulmonary/Chest: Effort normal and breath sounds normal. No respiratory distress. He has no wheezes.  Abdominal: Soft. Bowel sounds are normal. He exhibits no distension. There is no tenderness.  Musculoskeletal: Normal range of motion. He exhibits no edema or tenderness.  Neurological: He is alert and oriented to person, place, and time.  Skin: Skin is warm and dry. No rash noted. No erythema.  Psychiatric: He has a normal mood and affect. His behavior is normal. Judgment and thought content normal.  Vitals reviewed.     BP (!) 157/88   Pulse 84   Temp 97.5 F (36.4 C) (Oral)   Ht '5\' 11"'$  (1.803 m)   Wt (!) 393 lb 3.2 oz (178.4 kg)   BMI 54.84 kg/m        Assessment & Plan:  1. Essential hypertension -PT told to restart Zestoretic medication -Low Salt diet - CMP14+EGFR - Lipid panel - lisinopril-hydrochlorothiazide (PRINZIDE,ZESTORETIC) 10-12.5 MG tablet; Take 1 tablet by mouth daily.  Dispense: 30 tablet; Refill: 1  2. Morbid obesity with BMI of 50.0-59.9, adult ([facility]) _Encourage weight loss - CMP14+EGFR - Lipid panel  3. Erectile dysfunction, unspecified erectile dysfunction type - CMP14+EGFR - Testosterone,Free and Total  4. Decreased sexual desire -Will test testosterone levels today, could be related depression? Pt wants to wait until blood work before starting on Lexapro  - CMP14+EGFR - Testosterone,Free and Total   Continue all meds Labs pending Health Maintenance reviewed Diet and exercise encouraged RTO 1 month  Evelina Dun, FNP

## 2016-01-09 NOTE — Patient Instructions (Signed)
Testosterone Why am I having this test? Testosterone is a hormone made by the male's testicles and by the adrenal glands, which are a pair of glands on top of the kidneys. Starting at puberty, testosterone stimulates the development of secondary sex characteristics. This includes a deeper voice, growth of muscles and body hair, and penis enlargement. Females also produce testosterone in both the adrenal glands and ovaries. A male's body converts testosterone into estradiol, the main male sex hormone. An abnormal level of testosterone can cause health issues in both males and females. You may have this test if your health care provider suspects that an abnormal testosterone level is causing or contributing to other health problems. In males, symptoms of an abnormal testosterone level include:  Infertility.  Erectile dysfunction.  Delayed puberty or premature puberty. In females, symptoms of an abnormally high testosterone level include:  Infertility.  Polycystic ovarian syndrome (PCOS).  Developing masculine features (virilization). What kind of sample is taken? This test requires a blood sample taken from a vein in your arm or hand. The sample for this test is usually collected in the morning. The amount of testosterone in your blood is highest at that time. What do the results mean? It is your responsibility to obtain your test results. Ask the lab or department performing the test when and how you will get your results. Contact your health care provider to discuss any questions you have about your results. The result of a blood test for testosterone will be given as a range of values. A testosterone level that is outside the normal range may indicate a health problem. Testosterone is measured in nanograms per deciliter (ng/dL). Range of normal values  Ranges for normal values may vary among different labs and hospitals. You should always check with your health care provider after  having lab work or other tests done to discuss whether your values are considered within normal limits. Normal levels of total testosterone are as follows:  Male:  7 months to 48 years old: less than 30 ng/dL.  10-13 years old: less than 300 ng/dL.  14-15 years old: 170-540 ng/dL.  16-19 years old: 250-910 ng/dL.  48 years old and over: 280-1,080 ng/dL.  Male:  7 months to 48 years old: less than 30 ng/dL.  10-13 years old: less than 40 ng/dL.  14-15 years old: less than 60 ng/dL.  16-19 years old: less than 70 ng/dL.  48 years old and over: less than 70 ng/dL. Meaning of results outside normal value ranges  A testosterone level that is too low or too high can indicate a number of health problems. In males:  A high testosterone level can occur if you:  Have certain types of tumors.  Have an overactive thyroid gland (hyperthyroidism).  Use anabolic steroids.  Are starting puberty early (precocious puberty).  Have an inherited disorder that affects the adrenal glands (congenital adrenal hyperplasia).  A low testosterone level can occur if you:  Have certain genetic diseases.  Have had certain viral infections, such as mumps.  Have pituitary disease.  Have had an injury to the testicles.  Are an alcoholic. In females:  A high testosterone level can occur if you have:  Certain types of tumors.  An inherited disorder that affects certain cells in the adrenal glands (congenital adrenocortical hyperplasia).  PCOS.  A low testosterone level does not cause health problems. Discuss the results of your testosterone test with your health care provider. Your health care provider will use   the results of this test and other tests to make a diagnosis. Talk with your health care provider to discuss your results, treatment options, and if necessary, the need for more tests. Talk with your health care provider if you have any questions about your results. This  information is not intended to replace advice given to you by your health care provider. Make sure you discuss any questions you have with your health care provider. Document Released: 01/26/2004 Document Revised: 09/10/2015 Document Reviewed: 05/06/2013 Elsevier Interactive Patient Education  2017 Elsevier Inc.  

## 2016-01-10 LAB — LIPID PANEL
CHOL/HDL RATIO: 4.7 ratio (ref 0.0–5.0)
CHOLESTEROL TOTAL: 154 mg/dL (ref 100–199)
HDL: 33 mg/dL — AB (ref >39)
LDL Calculated: 94 mg/dL (ref 0–99)
TRIGLYCERIDES: 134 mg/dL (ref 0–149)
VLDL Cholesterol Cal: 27 mg/dL (ref 5–40)

## 2016-01-10 LAB — CMP14+EGFR
ALT: 15 IU/L (ref 0–44)
AST: 16 IU/L (ref 0–40)
Albumin/Globulin Ratio: 1.7 (ref 1.2–2.2)
Albumin: 4 g/dL (ref 3.5–5.5)
Alkaline Phosphatase: 71 IU/L (ref 39–117)
BUN/Creatinine Ratio: 9 (ref 9–20)
BUN: 6 mg/dL (ref 6–24)
Bilirubin Total: 0.3 mg/dL (ref 0.0–1.2)
CALCIUM: 8.6 mg/dL — AB (ref 8.7–10.2)
CO2: 27 mmol/L (ref 18–29)
CREATININE: 0.67 mg/dL — AB (ref 0.76–1.27)
Chloride: 96 mmol/L (ref 96–106)
GFR calc Af Amer: 131 mL/min/{1.73_m2} (ref >59)
GFR, EST NON AFRICAN AMERICAN: 114 mL/min/{1.73_m2} (ref >59)
Globulin, Total: 2.3 g/dL (ref 1.5–4.5)
Glucose: 102 mg/dL — ABNORMAL HIGH (ref 65–99)
POTASSIUM: 3.7 mmol/L (ref 3.5–5.2)
Sodium: 138 mmol/L (ref 134–144)
Total Protein: 6.3 g/dL (ref 6.0–8.5)

## 2016-01-10 LAB — TESTOSTERONE,FREE AND TOTAL
Testosterone, Free: 1.5 pg/mL — ABNORMAL LOW (ref 6.8–21.5)
Testosterone: 41 ng/dL — ABNORMAL LOW (ref 264–916)

## 2016-01-11 ENCOUNTER — Other Ambulatory Visit: Payer: Self-pay | Admitting: Family

## 2016-01-11 MED ORDER — TESTOSTERONE 25 MG/2.5GM (1%) TD GEL: 50.0000 mg | Freq: Every day | TRANSDERMAL | 3 refills | Status: DC

## 2016-02-09 ENCOUNTER — Ambulatory Visit: Payer: Medicaid Other | Admitting: Family

## 2016-02-23 ENCOUNTER — Ambulatory Visit (INDEPENDENT_AMBULATORY_CARE_PROVIDER_SITE_OTHER): Payer: Medicaid Other | Admitting: Family

## 2016-02-23 ENCOUNTER — Encounter: Payer: Self-pay | Admitting: Family

## 2016-02-23 VITALS — BP 128/82 | HR 88 | Temp 98.9°F | Ht 71.0 in | Wt 390.4 lb

## 2016-02-23 DIAGNOSIS — G8929 Other chronic pain: Secondary | ICD-10-CM

## 2016-02-23 DIAGNOSIS — Z6841 Body Mass Index (BMI) 40.0 and over, adult: Secondary | ICD-10-CM

## 2016-02-23 DIAGNOSIS — E349 Endocrine disorder, unspecified: Secondary | ICD-10-CM | POA: Insufficient documentation

## 2016-02-23 DIAGNOSIS — M545 Low back pain: Secondary | ICD-10-CM | POA: Diagnosis not present

## 2016-02-23 DIAGNOSIS — N529 Male erectile dysfunction, unspecified: Secondary | ICD-10-CM

## 2016-02-23 DIAGNOSIS — I10 Essential (primary) hypertension: Secondary | ICD-10-CM | POA: Diagnosis not present

## 2016-02-23 MED ORDER — TESTOSTERONE CYPIONATE 200 MG/ML IM KIT: 200.0000 mg | PACK | INTRAMUSCULAR | 1 refills | Status: DC

## 2016-02-23 NOTE — Progress Notes (Signed)
Subjective:    Patient ID: Walter [name], male    DOB: [birth year], 51 y.o.   MRN: [id number]  PT presents to the office today for chronic follow up. [facility]  PT states he feels so much better since starting on testosterone. PT states he feels his depression is better even since starting on the testosterone. Pt states he would like to try testosterone IM instead of the gel.  Hypertension  This is a chronic problem. The current episode started more than 1 year ago. The problem has been resolved since onset. The problem is controlled. Pertinent negatives include no blurred vision, headaches, palpitations or peripheral edema. Risk factors for coronary artery disease include obesity. Past treatments include ACE inhibitors and diuretics. The current treatment provides moderate improvement. There is no history of kidney disease, CAD/MI, CVA or heart failure. There is no history of a thyroid problem.  Back Pain  This is a chronic problem. The current episode started more than 1 year ago. The problem occurs constantly. The problem is unchanged. The pain is present in the lumbar spine. The quality of the pain is described as aching. The pain is moderate. Associated symptoms include leg pain. Pertinent negatives include no dysuria or headaches. Risk factors include obesity. He has tried analgesics and bed rest for the symptoms. The treatment provided moderate relief.  ED Pt states the testosterone has helped slightly. Stable.    Review of Systems  Eyes: Negative for blurred vision.  Cardiovascular: Negative for palpitations.  Genitourinary: Negative for dysuria.  Musculoskeletal: Positive for back pain.  Neurological: Negative for headaches.  All other systems reviewed and are negative.      Objective:   Physical Exam  Constitutional: He is oriented to person, place, and time. He appears well-developed and well-nourished. No distress.  Morbid obese   HENT:    Head: Normocephalic.  Right Ear: External ear normal.  Left Ear: External ear normal.  Nose: Nose normal.  Mouth/Throat: Oropharynx is clear and moist.  Eyes: Pupils are equal, round, and reactive to light. Right eye exhibits no discharge. Left eye exhibits no discharge.  Neck: Normal range of motion. Neck supple. No thyromegaly present.  Cardiovascular: Normal rate, regular rhythm, normal heart sounds and intact distal pulses.   No murmur heard. Pulmonary/Chest: Effort normal and breath sounds normal. No respiratory distress. He has no wheezes.  Abdominal: Soft. Bowel sounds are normal. He exhibits no distension. There is no tenderness.  Musculoskeletal: Normal range of motion. He exhibits no edema or tenderness.  Neurological: He is alert and oriented to person, place, and time.  Skin: Skin is warm and dry. No rash noted. No erythema.  Psychiatric: He has a normal mood and affect. His behavior is normal. Judgment and thought content normal.  Vitals reviewed.     BP 128/82   Pulse 88   Temp 98.9 F (37.2 C) (Oral)   Ht '5\' 11"'$  (1.803 m)   Wt (!) 390 lb 6.4 oz (177.1 kg)   BMI 54.45 kg/m      Assessment & Plan:  1. Testosterone deficiency -Will change testosterone gel to testosterone cypionate IM - CMP14+EGFR - Testosterone Cypionate 200 MG/ML KIT; Inject 200 mg into the muscle every 14 (fourteen) days.  Dispense: 6 kit; Refill: 1  2. Morbid obesity with BMI of 50.0-59.9, adult ([place]) - CMP14+EGFR  3. Essential hypertension - CMP14+EGFR  4. Erectile dysfunction, unspecified erectile dysfunction type - CMP14+EGFR - Testosterone Cypionate 200  MG/ML KIT; Inject 200 mg into the muscle every 14 (fourteen) days.  Dispense: 6 kit; Refill: 1  5. Chronic bilateral low back pain, with sciatica presence unspecified - CMP14+EGFR   Continue all meds Labs pending Health Maintenance reviewed Diet and exercise encouraged RTO 3  Months   Evelina Dun, FNP

## 2016-02-23 NOTE — Patient Instructions (Signed)
Testosterone injection What is this medicine? TESTOSTERONE (tes TOS ter one) is the main male hormone. It supports normal male development such as muscle growth, facial hair, and deep voice. It is used in males to treat low testosterone levels. This medicine may be used for other purposes; ask your health care provider or pharmacist if you have questions. COMMON BRAND NAME(S): Andro-L.A., Aveed, Delatestryl, Depo-Testosterone, Virilon What should I tell my health care provider before I take this medicine? They need to know if you have any of these conditions: -cancer -diabetes -heart disease -kidney disease -liver disease -lung disease -prostate disease -an unusual or allergic reaction to testosterone, other medicines, foods, dyes, or preservatives -pregnant or trying to get pregnant -breast-feeding How should I use this medicine? This medicine is for injection into a muscle. It is usually given by a health care professional in a hospital or clinic setting. Contact your pediatrician regarding the use of this medicine in children. While this medicine may be prescribed for children as young as 12 years of age for selected conditions, precautions do apply. Overdosage: If you think you have taken too much of this medicine contact a poison control center or emergency room at once. NOTE: This medicine is only for you. Do not share this medicine with others. What if I miss a dose? Try not to miss a dose. Your doctor or health care professional will tell you when your next injection is due. Notify the office if you are unable to keep an appointment. What may interact with this medicine? -medicines for diabetes -medicines that treat or prevent blood clots like warfarin -oxyphenbutazone -propranolol -steroid medicines like prednisone or cortisone This list may not describe all possible interactions. Give your health care provider a list of all the medicines, herbs, non-prescription drugs, or  dietary supplements you use. Also tell them if you smoke, drink alcohol, or use illegal drugs. Some items may interact with your medicine. What should I watch for while using this medicine? Visit your doctor or health care professional for regular checks on your progress. They will need to check the level of testosterone in your blood. This medicine is only approved for use in men who have low levels of testosterone related to certain medical conditions. Heart attacks and strokes have been reported with the use of this medicine. Notify your doctor or health care professional and seek emergency treatment if you develop breathing problems; changes in vision; confusion; chest pain or chest tightness; sudden arm pain; severe, sudden headache; trouble speaking or understanding; sudden numbness or weakness of the face, arm or leg; loss of balance or coordination. Talk to your doctor about the risks and benefits of this medicine. This medicine may affect blood sugar levels. If you have diabetes, check with your doctor or health care professional before you change your diet or the dose of your diabetic medicine. Testosterone injections are not commonly used in women. Women should inform their doctor if they wish to become pregnant or think they might be pregnant. There is a potential for serious side effects to an unborn child. Talk to your health care professional or pharmacist for more information. Talk with your doctor or health care professional about your birth control options while taking this medicine. This drug is banned from use in athletes by most athletic organizations. What side effects may I notice from receiving this medicine? Side effects that you should report to your doctor or health care professional as soon as possible: -allergic reactions like skin rash,   itching or hives, swelling of the face, lips, or tongue -breast enlargement -breathing problems -changes in emotions or moods -deep or  hoarse voice -irregular menstrual periods -signs and symptoms of liver injury like dark yellow or brown urine; general ill feeling or flu-like symptoms; light-colored stools; loss of appetite; nausea; right upper belly pain; unusually weak or tired; yellowing of the eyes or skin -stomach pain -swelling of the ankles, feet, hands -too frequent or persistent erections -trouble passing urine or change in the amount of urine Side effects that usually do not require medical attention (report to your doctor or health care professional if they continue or are bothersome): -acne -change in sex drive or performance -facial hair growth -hair loss -headache This list may not describe all possible side effects. Call your doctor for medical advice about side effects. You may report side effects to FDA at 1-800-FDA-1088. Where should I keep my medicine? Keep out of the reach of children. This medicine can be abused. Keep your medicine in a safe place to protect it from theft. Do not share this medicine with anyone. Selling or giving away this medicine is dangerous and against the law. Store at room temperature between 20 and 25 degrees C (68 and 77 degrees F). Do not freeze. Protect from light. Follow the directions for the product you are prescribed. Throw away any unused medicine after the expiration date. NOTE: This sheet is a summary. It may not cover all possible information. If you have questions about this medicine, talk to your doctor, pharmacist, or health care provider.  2017 Elsevier/Gold Standard (2015-02-12 07:33:55)  

## 2016-02-24 LAB — CMP14+EGFR
A/G RATIO: 1.4 (ref 1.2–2.2)
ALBUMIN: 4 g/dL (ref 3.5–5.5)
ALK PHOS: 66 IU/L (ref 39–117)
ALT: 12 IU/L (ref 0–44)
AST: 17 IU/L (ref 0–40)
BUN / CREAT RATIO: 8 — AB (ref 9–20)
BUN: 6 mg/dL (ref 6–24)
Bilirubin Total: 0.3 mg/dL (ref 0.0–1.2)
CO2: 25 mmol/L (ref 18–29)
Calcium: 9.3 mg/dL (ref 8.7–10.2)
Chloride: 96 mmol/L (ref 96–106)
Creatinine, Ser: 0.72 mg/dL — ABNORMAL LOW (ref 0.76–1.27)
GFR calc Af Amer: 128 mL/min/{1.73_m2} (ref >59)
GFR, EST NON AFRICAN AMERICAN: 110 mL/min/{1.73_m2} (ref >59)
GLOBULIN, TOTAL: 2.8 g/dL (ref 1.5–4.5)
Glucose: 113 mg/dL — ABNORMAL HIGH (ref 65–99)
POTASSIUM: 4.3 mmol/L (ref 3.5–5.2)
Sodium: 140 mmol/L (ref 134–144)
Total Protein: 6.8 g/dL (ref 6.0–8.5)

## 2016-04-19 ENCOUNTER — Other Ambulatory Visit: Payer: Self-pay | Admitting: *Deleted

## 2016-04-19 DIAGNOSIS — E349 Endocrine disorder, unspecified: Secondary | ICD-10-CM

## 2016-04-19 DIAGNOSIS — N529 Male erectile dysfunction, unspecified: Secondary | ICD-10-CM

## 2016-04-19 MED ORDER — TESTOSTERONE CYPIONATE 200 MG/ML IM KIT: 200.0000 mg | PACK | INTRAMUSCULAR | 1 refills | Status: DC

## 2016-04-30 ENCOUNTER — Encounter: Payer: Self-pay | Admitting: Family

## 2016-04-30 ENCOUNTER — Ambulatory Visit (INDEPENDENT_AMBULATORY_CARE_PROVIDER_SITE_OTHER): Payer: Medicaid Other | Admitting: Family

## 2016-04-30 VITALS — BP 132/77 | HR 80 | Temp 97.2°F | Ht 71.0 in | Wt 397.0 lb

## 2016-04-30 DIAGNOSIS — Z6841 Body Mass Index (BMI) 40.0 and over, adult: Secondary | ICD-10-CM | POA: Diagnosis not present

## 2016-04-30 DIAGNOSIS — N529 Male erectile dysfunction, unspecified: Secondary | ICD-10-CM | POA: Diagnosis not present

## 2016-04-30 DIAGNOSIS — E349 Endocrine disorder, unspecified: Secondary | ICD-10-CM

## 2016-04-30 DIAGNOSIS — K429 Umbilical hernia without obstruction or gangrene: Secondary | ICD-10-CM | POA: Diagnosis not present

## 2016-04-30 MED ORDER — TESTOSTERONE CYPIONATE 200 MG/ML IM KIT: 200.0000 mg | PACK | INTRAMUSCULAR | 1 refills | Status: DC

## 2016-04-30 NOTE — Patient Instructions (Addendum)
Umbilical Hernia, Adult  A hernia is a bulge of tissue that pushes through an opening between muscles. An umbilical hernia happens in the abdomen, near the belly button (umbilicus). The hernia may contain tissues from the small intestine, large intestine, or fatty tissue covering the intestines (omentum). Umbilical hernias in adults tend to get worse over time, and they require surgical treatment.  There are several types of umbilical hernias. You may have:  · A hernia located just above or below the umbilicus (indirect hernia). This is the most common type of umbilical hernia in adults.  · A hernia that forms through an opening formed by the umbilicus (direct hernia).  · A hernia that comes and goes (reducible hernia). A reducible hernia may be visible only when you strain, lift something heavy, or cough. This type of hernia can be pushed back into the abdomen (reduced).  · A hernia that traps abdominal tissue inside the hernia (incarcerated hernia). This type of hernia cannot be reduced.  · A hernia that cuts off blood flow to the tissues inside the hernia (strangulated hernia). The tissues can start to die if this happens. This type of hernia requires emergency treatment.    What are the causes?  An umbilical hernia happens when tissue inside the abdomen presses on a weak area of the abdominal muscles.  What increases the risk?  You may have a greater risk of this condition if you:  · Are obese.  · Have had several pregnancies.  · Have a buildup of fluid inside your abdomen (ascites).  · Have had surgery that weakens the abdominal muscles.    What are the signs or symptoms?  The main symptom of this condition is a painless bulge at or near the belly button. A reducible hernia may be visible only when you strain, lift something heavy, or cough. Other symptoms may include:  · Dull pain.  · A feeling of pressure.    Symptoms of a strangulated hernia may include:  · Pain that gets increasingly worse.  · Nausea and  vomiting.  · Pain when pressing on the hernia.  · Skin over the hernia becoming red or purple.  · Constipation.  · Blood in the stool.    How is this diagnosed?  This condition may be diagnosed based on:  · A physical exam. You may be asked to cough or strain while standing. These actions increase the pressure inside your abdomen and force the hernia through the opening in your muscles. Your health care provider may try to reduce the hernia by pressing on it.  · Your symptoms and medical history.    How is this treated?  Surgery is the only treatment for an umbilical hernia. Surgery for a strangulated hernia is done as soon as possible. If you have a small hernia that is not incarcerated, you may need to lose weight before having surgery.  Follow these instructions at home:  · Lose weight, if told by your health care provider.  · Do not try to push the hernia back in.  · Watch your hernia for any changes in color or size. Tell your health care provider if any changes occur.  · You may need to avoid activities that increase pressure on your hernia.  · Do not lift anything that is heavier than 10 lb (4.5 kg) until your health care provider says that this is safe.  · Take over-the-counter and prescription medicines only as told by your health care provider.  ·   Keep all follow-up visits as told by your health care provider. This is important.  Contact a health care provider if:  · Your hernia gets larger.  · Your hernia becomes painful.  Get help right away if:  · You develop sudden, severe pain near the area of your hernia.  · You have pain as well as nausea or vomiting.  · You have pain and the skin over your hernia changes color.  · You develop a fever.  This information is not intended to replace advice given to you by your health care provider. Make sure you discuss any questions you have with your health care provider.  Document Released: 06/10/2015 Document Revised: 09/11/2015 Document Reviewed:  06/10/2015  Elsevier Interactive Patient Education © 2017 Elsevier Inc.

## 2016-04-30 NOTE — Progress Notes (Signed)
   Subjective:    Patient ID: Walter [name], male    DOB: 1965/08/09, 51 y.o.   MRN: [id number]  HPI Pt presents to the office today with "budging at my belly button". PT states he noticed last week  And "seems to be getting bigger". PT reports a mild aching pain of 4 out 10. Denies any changes any color. Denies any injury or lifting.    Review of Systems  HENT: Negative.   Eyes: Negative.   Gastrointestinal: Positive for abdominal pain.  Genitourinary: Negative.   Musculoskeletal: Positive for arthralgias, back pain and gait problem.       Objective:   Physical Exam  Constitutional: He is oriented to person, place, and time. He appears well-developed and well-nourished. No distress.  HENT:  Head: Normocephalic.  Eyes: Pupils are equal, round, and reactive to light. Right eye exhibits no discharge. Left eye exhibits no discharge.  Neck: Normal range of motion. Neck supple. No thyromegaly present.  Cardiovascular: Normal rate, regular rhythm, normal heart sounds and intact distal pulses.   No murmur heard. Pulmonary/Chest: Effort normal and breath sounds normal. No respiratory distress. He has no wheezes.  Abdominal: Soft. Bowel sounds are normal. He exhibits no distension. There is tenderness (mild tenderness in umbilical area).  umbilical hernia present    Musculoskeletal: Normal range of motion. He exhibits no edema or tenderness.  Neurological: He is alert and oriented to person, place, and time.  Skin: Skin is warm and dry. No rash noted. No erythema.  Psychiatric: He has a normal mood and affect. His behavior is normal. Judgment and thought content normal.  Vitals reviewed.    BP 132/77   Pulse 80   Temp 97.2 F (36.2 C) (Oral)   Ht  (1.803 m)   Wt (!) 397 lb (180.1 kg)   BMI 55.37 kg/m       Assessment & Plan:  1. Umbilical hernia without obstruction and without gangrene -Encourage weight loss -Do not lift anything heavier than 10 lbs -Report any  changes in color or increased pain RTO prn  - Ambulatory referral to General Surgery  2. Morbid obesity with BMI of 50.0-59.9, adult [facility]) - Ambulatory referral to General Surgery   Jannifer Rodney, FNP

## 2016-05-15 ENCOUNTER — Other Ambulatory Visit: Payer: Self-pay | Admitting: Surgery

## 2016-07-12 ENCOUNTER — Other Ambulatory Visit: Payer: Self-pay | Admitting: Family

## 2016-07-12 DIAGNOSIS — I10 Essential (primary) hypertension: Secondary | ICD-10-CM

## 2016-08-01 ENCOUNTER — Other Ambulatory Visit: Payer: Self-pay | Admitting: Surgery

## 2016-08-03 ENCOUNTER — Encounter (HOSPITAL_COMMUNITY): Payer: Self-pay | Admitting: *Deleted

## 2016-08-03 MED ORDER — DEXTROSE 5 % IV SOLN
3.0000 g | INTRAVENOUS | Status: AC
  Administered 2016-08-06: 3 g via INTRAVENOUS
  Filled 2016-08-03: qty 3000

## 2016-08-03 NOTE — Progress Notes (Signed)
   08/03/16 1421  OBSTRUCTIVE SLEEP APNEA  Have you ever been diagnosed with sleep apnea through a sleep study? No  Do you snore loudly (loud enough to be heard through closed doors)?  1  Do you often feel tired, fatigued, or sleepy during the daytime (such as falling asleep during driving or talking to someone)? 0  Has anyone observed you stop breathing during your sleep? 1  Do you have, or are you being treated for high blood pressure? 1  BMI more than 35 kg/m2? 1  Age > 50 (1-yes) 0  Neck circumference greater than:Male 16 inches or larger, Male 17inches or larger? 1  Male Gender (Yes=1) 1  Obstructive Sleep Apnea Score 6

## 2016-08-03 NOTE — Progress Notes (Signed)
Pt denies SOB, chest pain, and being under the care of a cardiologist. Pt denies having a stress test, echo and cardiac cath. Pt denies having an EKG and chest x ary within the last year. Pt denies having recent labs. Pt made aware to stop taking  Aspirin, vitamins, fish oil and herbal medications. Do not take any NSAIDs ie: Ibuprofen, Advil, Naproxen ( Aleve), BC and Goody Powder, Motrin or any medication containing Aspirin. Pt verbalized understanding of all pre-op instructions.

## 2016-08-05 NOTE — H&P (Signed)
Walter [name] 05/15/2016 11:27 AMLocation: [facility] Patient #: [id number] DOB: July 22, 1965 Divorced / Language: Walter [name] / Race: Refused to Report/Unreported Male   History of Present Illness  Patient words: New-um hernia.  The patient is a 51 year old male who presents with an umbilical hernia. This patient is referred to me by Dr. Evelina Dun for evaluation of a symptomatic umbilical hernia. He reports that several weeks ago while lifting when he started having discomfort at his umbilicus. He then can palpate a bulge at his umbilicus. He has no nausea or vomiting or obstructive symptoms. He reports that the pain has improved. He has had no similar discomfort in the past.   Past Surgical History  No pertinent past surgical history   Diagnostic Studies History Malachi Bonds, CMA;  Colonoscopy  never  Allergies Malachi Bonds, CMA;  No Known Drug Allergies   Medication History (Ladeana Laplant A. Ninfa Linden, MD;   Lisinopril-Hydrochlorothiazide (10-12.5MG Tablet, Oral) Active. Testosterone Cypionate (200MG/ML Kit, Intramuscular) Active. Medications Reconciled Methadone HCl (10MG Tablet, Oral) Active.  Social History Malachi Bonds, CMA Alcohol use  Remotely quit alcohol use. Caffeine use  Carbonated beverages, Coffee, Tea. Illicit drug use  Remotely quit drug use. Tobacco use  Never smoker.  Family History Malachi Bonds, CMA; Alcohol Abuse  Brother, Father. Depression  Mother.  Other Problems Malachi Bonds, CMA;  AM) Anxiety Disorder  Back Pain  Depression     Review of Systems Malachi Bonds CMA; General Present- Fatigue. Not Present- Appetite Loss, Chills, Fever, Night Sweats, Weight Gain and Weight Loss. HEENT Present- Wears glasses/contact lenses. Not Present- Earache, Hearing Loss, Hoarseness, Nose Bleed, Oral Ulcers, Ringing in the Ears, Seasonal Allergies, Sinus Pain, Sore Throat, Visual Disturbances and Yellow Eyes. Respiratory  Present- Snoring. Not Present- Bloody sputum, Chronic Cough, Difficulty Breathing and Wheezing. Cardiovascular Present- Swelling of Extremities. Not Present- Chest Pain, Difficulty Breathing Lying Down, Leg Cramps, Palpitations, Rapid Heart Rate and Shortness of Breath. Gastrointestinal Present- Abdominal Pain. Not Present- Bloating, Bloody Stool, Change in Bowel Habits, Chronic diarrhea, Constipation, Difficulty Swallowing, Excessive gas, Gets full quickly at meals, Hemorrhoids, Indigestion, Nausea, Rectal Pain and Vomiting. Male Genitourinary Present- Frequency, Nocturia and Urgency. Not Present- Blood in Urine, Change in Urinary Stream, Impotence, Painful Urination and Urine Leakage.  Vitals   Weight: 408.4 lb Height: 71in Body Surface Area: 2.86 m Body Mass Index: 56.96 kg/m  Temp.: 98.62F(Oral)  Pulse: 70 (Regular)  BP: 160/80 (Sitting, Left Arm, Standard)    Physical Exam  General Mental Status-Alert. General Appearance-Consistent with stated age. Hydration-Well hydrated. Voice-Normal.  Head and Neck Head-normocephalic, atraumatic with no lesions or palpable masses. Trachea-midline.  Eye Eyeball - Bilateral-Extraocular movements intact. Sclera/Conjunctiva - Bilateral-No scleral icterus.  Chest and Lung Exam Chest and lung exam reveals -quiet, even and easy respiratory effort with no use of accessory muscles and on auscultation, normal breath sounds, no adventitious sounds and normal vocal resonance. Inspection Chest Wall - Normal. Back - normal.  Cardiovascular Cardiovascular examination reveals -normal heart sounds, regular rate and rhythm with no murmurs and normal pedal pulses bilaterally.  Abdomen Inspection Skin - Scar - no surgical scars. Hernias - Umbilical hernia - Incarcerated. Note: His abdomen is morbidly obese. There is a palpable hernia with incarcerated omentum which is nontender in the depths of his  umbilicus. Palpation/Percussion Palpation and Percussion of the abdomen reveal - Soft, Non Tender, No Rebound tenderness, No Rigidity (guarding) and No hepatosplenomegaly. Auscultation Auscultation of the abdomen reveals - Bowel sounds normal.  Neurologic - Did  not examine.  Musculoskeletal - Did not examine.    Assessment & Plan   UMBILICAL HERNIA WITHOUT OBSTRUCTION OR GANGRENE (K42.9)  Impression: I discussed the diagnosis of umbilical hernia with him. I discussed hernia repair with mesh. I discussed both the laparoscopic and open techniques. This is a small fascial defect so I believe it is reasonable to do this open with mesh as an outpatient. I discussed the risk of surgery which includes but is not limited to bleeding, infection, injury to surrounding structures, recurrence, DVT, etc. I also discussed postoperative recovery. He understands and wishes to proceed with surgery.

## 2016-08-06 ENCOUNTER — Ambulatory Visit (HOSPITAL_COMMUNITY)
Admission: RE | Admit: 2016-08-06 | Discharge: 2016-08-06 | Disposition: A | Discharge status: Home / Self Care | Payer: Medicaid Other | Source: Ambulatory Visit | Attending: Surgery | Admitting: Surgery

## 2016-08-06 ENCOUNTER — Encounter (HOSPITAL_COMMUNITY)
Admission: RE | Disposition: A | Discharge status: Home / Self Care | Payer: Self-pay | Source: Ambulatory Visit | Attending: Surgery

## 2016-08-06 ENCOUNTER — Encounter (HOSPITAL_COMMUNITY): Payer: Self-pay

## 2016-08-06 ENCOUNTER — Ambulatory Visit (HOSPITAL_COMMUNITY): Payer: Medicaid Other | Admitting: Anesthesiology

## 2016-08-06 DIAGNOSIS — Z6841 Body Mass Index (BMI) 40.0 and over, adult: Secondary | ICD-10-CM | POA: Insufficient documentation

## 2016-08-06 DIAGNOSIS — F419 Anxiety disorder, unspecified: Secondary | ICD-10-CM | POA: Insufficient documentation

## 2016-08-06 DIAGNOSIS — Z79899 Other long term (current) drug therapy: Secondary | ICD-10-CM | POA: Diagnosis not present

## 2016-08-06 DIAGNOSIS — I10 Essential (primary) hypertension: Secondary | ICD-10-CM | POA: Insufficient documentation

## 2016-08-06 DIAGNOSIS — K420 Umbilical hernia with obstruction, without gangrene: Secondary | ICD-10-CM | POA: Insufficient documentation

## 2016-08-06 DIAGNOSIS — F329 Major depressive disorder, single episode, unspecified: Secondary | ICD-10-CM | POA: Diagnosis not present

## 2016-08-06 HISTORY — DX: Headache: R51

## 2016-08-06 HISTORY — DX: Umbilical hernia without obstruction or gangrene: K42.9

## 2016-08-06 HISTORY — DX: Essential (primary) hypertension: I10

## 2016-08-06 HISTORY — DX: Family history of other specified conditions: Z84.89

## 2016-08-06 HISTORY — PX: INSERTION OF MESH: SHX5868

## 2016-08-06 HISTORY — PX: UMBILICAL HERNIA REPAIR: SHX196

## 2016-08-06 LAB — BASIC METABOLIC PANEL
Anion gap: 8 (ref 5–15)
BUN: 9 mg/dL (ref 6–20)
CALCIUM: 8.9 mg/dL (ref 8.9–10.3)
CO2: 29 mmol/L (ref 22–32)
Chloride: 98 mmol/L — ABNORMAL LOW (ref 101–111)
Creatinine, Ser: 0.95 mg/dL (ref 0.61–1.24)
GFR calc non Af Amer: >60 mL/min (ref >60)
Glucose, Bld: 108 mg/dL — ABNORMAL HIGH (ref 65–99)
Potassium: 3.7 mmol/L (ref 3.5–5.1)
SODIUM: 135 mmol/L (ref 135–145)

## 2016-08-06 LAB — CBC
HEMATOCRIT: 43.1 % (ref 39.0–52.0)
Hemoglobin: 13.4 g/dL (ref 13.0–17.0)
MCH: 27 pg (ref 26.0–34.0)
MCHC: 31.1 g/dL (ref 30.0–36.0)
MCV: 86.9 fL (ref 78.0–100.0)
Platelets: 206 10*3/uL (ref 150–400)
RBC: 4.96 MIL/uL (ref 4.22–5.81)
RDW: 14.5 % (ref 11.5–15.5)
WBC: 7 10*3/uL (ref 4.0–10.5)

## 2016-08-06 SURGERY — REPAIR, HERNIA, UMBILICAL, ADULT
Anesthesia: General

## 2016-08-06 MED ORDER — CHLORHEXIDINE GLUCONATE CLOTH 2 % EX PADS: 6.0000 | MEDICATED_PAD | Freq: Once | CUTANEOUS | Status: DC

## 2016-08-06 MED ORDER — FENTANYL CITRATE (PF) 100 MCG/2ML IJ SOLN
INTRAMUSCULAR | Status: DC | PRN
  Administered 2016-08-06: 50 ug via INTRAVENOUS
  Administered 2016-08-06: 150 ug via INTRAVENOUS
  Administered 2016-08-06: 50 ug via INTRAVENOUS

## 2016-08-06 MED ORDER — MIDAZOLAM HCL 2 MG/2ML IJ SOLN
INTRAMUSCULAR | Status: AC
  Filled 2016-08-06: qty 2

## 2016-08-06 MED ORDER — FENTANYL CITRATE (PF) 100 MCG/2ML IJ SOLN: 25.0000 ug | INTRAMUSCULAR | Status: DC | PRN

## 2016-08-06 MED ORDER — BUPIVACAINE-EPINEPHRINE 0.25% -1:200000 IJ SOLN
INTRAMUSCULAR | Status: AC
  Filled 2016-08-06: qty 1

## 2016-08-06 MED ORDER — KETAMINE HCL 10 MG/ML IJ SOLN
INTRAMUSCULAR | Status: DC | PRN
  Administered 2016-08-06: 40 mg via INTRAVENOUS
  Administered 2016-08-06: 60 mg via INTRAVENOUS

## 2016-08-06 MED ORDER — ACETAMINOPHEN 650 MG RE SUPP
650.0000 mg | RECTAL | Status: DC | PRN
  Filled 2016-08-06: qty 1

## 2016-08-06 MED ORDER — SODIUM CHLORIDE 0.9 % IV SOLN: 250.0000 mL | INTRAVENOUS | Status: DC | PRN

## 2016-08-06 MED ORDER — 0.9 % SODIUM CHLORIDE (POUR BTL) OPTIME
TOPICAL | Status: DC | PRN
  Administered 2016-08-06: 1000 mL

## 2016-08-06 MED ORDER — MORPHINE SULFATE (PF) 2 MG/ML IV SOLN: 1.0000 mg | INTRAVENOUS | Status: DC | PRN

## 2016-08-06 MED ORDER — GLYCOPYRROLATE 0.2 MG/ML IJ SOLN
INTRAMUSCULAR | Status: DC | PRN
  Administered 2016-08-06: .2 mg via INTRAVENOUS

## 2016-08-06 MED ORDER — MIDAZOLAM HCL 5 MG/5ML IJ SOLN
INTRAMUSCULAR | Status: DC | PRN
  Administered 2016-08-06: 2 mg via INTRAVENOUS

## 2016-08-06 MED ORDER — SUCCINYLCHOLINE CHLORIDE 20 MG/ML IJ SOLN
INTRAMUSCULAR | Status: DC | PRN
  Administered 2016-08-06: 180 mg via INTRAVENOUS

## 2016-08-06 MED ORDER — LIDOCAINE HCL (CARDIAC) 20 MG/ML IV SOLN
INTRAVENOUS | Status: DC | PRN
  Administered 2016-08-06: 100 mg via INTRAVENOUS

## 2016-08-06 MED ORDER — FENTANYL CITRATE (PF) 250 MCG/5ML IJ SOLN
INTRAMUSCULAR | Status: AC
  Filled 2016-08-06: qty 5

## 2016-08-06 MED ORDER — OXYCODONE HCL 5 MG PO TABS: 5.0000 mg | ORAL_TABLET | ORAL | 0 refills | Status: DC | PRN

## 2016-08-06 MED ORDER — BUPIVACAINE HCL (PF) 0.25 % IJ SOLN
INTRAMUSCULAR | Status: DC | PRN
  Administered 2016-08-06: 20 mL

## 2016-08-06 MED ORDER — PROPOFOL 10 MG/ML IV BOLUS
INTRAVENOUS | Status: AC
  Filled 2016-08-06: qty 20

## 2016-08-06 MED ORDER — SODIUM CHLORIDE 0.9% FLUSH: 3.0000 mL | Freq: Two times a day (BID) | INTRAVENOUS | Status: DC

## 2016-08-06 MED ORDER — LACTATED RINGERS IV SOLN
INTRAVENOUS | Status: DC
  Administered 2016-08-06: 08:00:00 via INTRAVENOUS

## 2016-08-06 MED ORDER — PROPOFOL 10 MG/ML IV BOLUS
INTRAVENOUS | Status: DC | PRN
  Administered 2016-08-06: 200 mg via INTRAVENOUS

## 2016-08-06 MED ORDER — KETAMINE HCL-SODIUM CHLORIDE 100-0.9 MG/10ML-% IV SOSY
PREFILLED_SYRINGE | INTRAVENOUS | Status: AC
  Filled 2016-08-06: qty 10

## 2016-08-06 MED ORDER — OXYCODONE HCL 5 MG PO TABS: 5.0000 mg | ORAL_TABLET | ORAL | Status: DC | PRN

## 2016-08-06 MED ORDER — ONDANSETRON HCL 4 MG/2ML IJ SOLN: 4.0000 mg | Freq: Once | INTRAMUSCULAR | Status: DC | PRN

## 2016-08-06 MED ORDER — ONDANSETRON HCL 4 MG/2ML IJ SOLN
INTRAMUSCULAR | Status: DC | PRN
  Administered 2016-08-06: 4 mg via INTRAVENOUS

## 2016-08-06 MED ORDER — SODIUM CHLORIDE 0.9% FLUSH: 3.0000 mL | INTRAVENOUS | Status: DC | PRN

## 2016-08-06 MED ORDER — KETOROLAC TROMETHAMINE 30 MG/ML IJ SOLN
INTRAMUSCULAR | Status: DC | PRN
  Administered 2016-08-06: 30 mg via INTRAVENOUS

## 2016-08-06 MED ORDER — ACETAMINOPHEN 325 MG PO TABS
650.0000 mg | ORAL_TABLET | ORAL | Status: DC | PRN
  Filled 2016-08-06: qty 2

## 2016-08-06 MED ORDER — EPHEDRINE SULFATE 50 MG/ML IJ SOLN
INTRAMUSCULAR | Status: DC | PRN
  Administered 2016-08-06: 10 mg via INTRAVENOUS
  Administered 2016-08-06: 15 mg via INTRAVENOUS
  Administered 2016-08-06 (×2): 10 mg via INTRAVENOUS

## 2016-08-06 MED ORDER — ONDANSETRON HCL 4 MG/2ML IJ SOLN
INTRAMUSCULAR | Status: AC
  Filled 2016-08-06: qty 2

## 2016-08-06 SURGICAL SUPPLY — 39 items
BLADE CLIPPER SURG (BLADE) ×3 IMPLANT
BLADE SURG 10 STRL SS (BLADE) ×3 IMPLANT
BLADE SURG 15 STRL LF DISP TIS (BLADE) ×1 IMPLANT
BLADE SURG 15 STRL SS (BLADE) ×2
CANISTER SUCT 3000ML PPV (MISCELLANEOUS) ×3 IMPLANT
CHLORAPREP W/TINT 26ML (MISCELLANEOUS) ×3 IMPLANT
COVER SURGICAL LIGHT HANDLE (MISCELLANEOUS) ×3 IMPLANT
DECANTER SPIKE VIAL GLASS SM (MISCELLANEOUS) ×3 IMPLANT
DERMABOND ADVANCED (GAUZE/BANDAGES/DRESSINGS) ×2
DERMABOND ADVANCED .7 DNX12 (GAUZE/BANDAGES/DRESSINGS) ×1 IMPLANT
DRAPE LAPAROTOMY 100X72 PEDS (DRAPES) ×3 IMPLANT
DRAPE UTILITY XL STRL (DRAPES) ×3 IMPLANT
ELECT CAUTERY BLADE 6.4 (BLADE) ×3 IMPLANT
ELECT REM PT RETURN 9FT ADLT (ELECTROSURGICAL) ×3
ELECTRODE REM PT RTRN 9FT ADLT (ELECTROSURGICAL) ×1 IMPLANT
GLOVE SURG SIGNA 7.5 PF LTX (GLOVE) ×3 IMPLANT
GOWN STRL REUS W/ TWL LRG LVL3 (GOWN DISPOSABLE) ×2 IMPLANT
GOWN STRL REUS W/ TWL XL LVL3 (GOWN DISPOSABLE) ×1 IMPLANT
GOWN STRL REUS W/TWL LRG LVL3 (GOWN DISPOSABLE) ×4
GOWN STRL REUS W/TWL XL LVL3 (GOWN DISPOSABLE) ×2
KIT BASIN OR (CUSTOM PROCEDURE TRAY) ×3 IMPLANT
KIT ROOM TURNOVER OR (KITS) ×3 IMPLANT
MESH VENTRALEX ST 2.5 CRC MED (Mesh General) ×3 IMPLANT
NEEDLE HYPO 25GX1X1/2 BEV (NEEDLE) ×3 IMPLANT
NS IRRIG 1000ML POUR BTL (IV SOLUTION) ×3 IMPLANT
PACK SURGICAL SETUP 50X90 (CUSTOM PROCEDURE TRAY) ×3 IMPLANT
PAD ARMBOARD 7.5X6 YLW CONV (MISCELLANEOUS) ×3 IMPLANT
PENCIL BUTTON HOLSTER BLD 10FT (ELECTRODE) ×3 IMPLANT
SPONGE LAP 18X18 X RAY DECT (DISPOSABLE) ×6 IMPLANT
SUT MNCRL AB 4-0 PS2 18 (SUTURE) ×3 IMPLANT
SUT NOVA NAB DX-16 0-1 5-0 T12 (SUTURE) ×9 IMPLANT
SUT VIC AB 3-0 SH 27 (SUTURE) ×4
SUT VIC AB 3-0 SH 27X BRD (SUTURE) ×2 IMPLANT
SYR CONTROL 10ML LL (SYRINGE) ×3 IMPLANT
TOWEL OR 17X24 6PK STRL BLUE (TOWEL DISPOSABLE) ×3 IMPLANT
TOWEL OR 17X26 10 PK STRL BLUE (TOWEL DISPOSABLE) IMPLANT
TUBE CONNECTING 12'X1/4 (SUCTIONS) ×1
TUBE CONNECTING 12X1/4 (SUCTIONS) ×2 IMPLANT
YANKAUER SUCT BULB TIP NO VENT (SUCTIONS) ×3 IMPLANT

## 2016-08-06 NOTE — Anesthesia Procedure Notes (Addendum)
Procedure Name: Intubation Date/Time: 08/06/2016 8:47 AM Performed by: Faustino CongressWHITE, Bralen Wiltgen TENA Anneta Rounds Pre-anesthesia Checklist: Patient identified, Emergency Drugs available, Suction available and Patient being monitored Patient Re-evaluated:Patient Re-evaluated prior to induction Oxygen Delivery Method: Circle System Utilized Preoxygenation: Pre-oxygenation with 100% oxygen Induction Type: IV induction and Rapid sequence Ventilation: Oral airway inserted - appropriate to patient size, Mask ventilation with difficulty and Two handed mask ventilation required Laryngoscope Size: Glidescope and 4 (Elective glidescope d/t body habitus small OA, and MP ) Grade View: Grade I Tube type: Oral Tube size: 7.5 mm Number of attempts: 1 Airway Equipment and Method: Stylet and Oral airway Placement Confirmation: ETT inserted through vocal cords under direct vision,  positive ETCO2 and breath sounds checked- equal and bilateral Secured at: 23 cm Tube secured with: Tape Dental Injury: Teeth and Oropharynx as per pre-operative assessment  Comments: Intubation performed by Oswaldo MilianKelli Pike, SRNA

## 2016-08-06 NOTE — Progress Notes (Signed)
   08/06/16 0724  OBSTRUCTIVE SLEEP APNEA  Have you ever been diagnosed with sleep apnea through a sleep study? No  Do you snore loudly (loud enough to be heard through closed doors)?  1  Do you often feel tired, fatigued, or sleepy during the daytime (such as falling asleep during driving or talking to someone)? 0  Has anyone observed you stop breathing during your sleep? 1  Do you have, or are you being treated for high blood pressure? 1  BMI more than 35 kg/m2? 1  Age > 50 (1-yes) 0  Neck circumference greater than:Male 16 inches or larger, Male 17inches or larger? 1  Male Gender (Yes=1) 1  Obstructive Sleep Apnea Score 6  Score 5 or greater  Results sent to PCP

## 2016-08-06 NOTE — Anesthesia Preprocedure Evaluation (Addendum)
Anesthesia Evaluation  Patient identified by MRN, date of birth, ID band Patient awake    Reviewed: Allergy & Precautions, NPO status , Patient's Chart, lab work & pertinent test results  Airway Mallampati: IV  TM Distance: >3 FB Neck ROM: Full    Dental no notable dental hx.    Pulmonary neg pulmonary ROS,    Pulmonary exam normal breath sounds clear to auscultation       Cardiovascular hypertension, Pt. on medications Normal cardiovascular exam Rhythm:Regular Rate:Normal  ECG: NSR, rate 78   Neuro/Psych  Headaches, Bipolar Disorder    GI/Hepatic negative GI ROS, (+)     substance abuse  ,   Endo/Other  Morbid obesity  Renal/GU negative Renal ROS  negative genitourinary   Musculoskeletal negative musculoskeletal ROS (+)   Abdominal   Peds negative pediatric ROS (+)  Hematology negative hematology ROS (+)   Anesthesia Other Findings Spinal stenosis ADD Super obese  Reproductive/Obstetrics negative OB ROS                            Anesthesia Physical Anesthesia Plan  ASA: III  Anesthesia Plan: General   Post-op Pain Management:    Induction: Intravenous  PONV Risk Score and Plan: 2 and Ondansetron, Dexamethasone and Propofol  Airway Management Planned: LMA  Additional Equipment:   Intra-op Plan:   Post-operative Plan: Extubation in OR  Informed Consent: I have reviewed the patients History and Physical, chart, labs and discussed the procedure including the risks, benefits and alternatives for the proposed anesthesia with the patient or authorized representative who has indicated his/her understanding and acceptance.   Dental advisory given  Plan Discussed with: CRNA  Anesthesia Plan Comments:        Anesthesia Quick Evaluation

## 2016-08-06 NOTE — Discharge Instructions (Signed)
[facility] _______[facility] ° °UMBILICAL OR INGUINAL HERNIA REPAIR: POST OP INSTRUCTIONS ° °Always review your discharge instruction sheet given to you by the facility where your surgery was performed. °IF YOU HAVE DISABILITY OR FAMILY LEAVE FORMS, YOU MUST BRING THEM TO THE OFFICE FOR PROCESSING.   °DO NOT GIVE THEM TO YOUR DOCTOR. ° °1. A  prescription for pain medication may be given to you upon discharge.  Take your pain medication as prescribed, if needed.  If narcotic pain medicine is not needed, then you may take acetaminophen (Tylenol) or ibuprofen (Advil) as needed. °2. Take your usually prescribed medications unless otherwise directed. °If you need a refill on your pain medication, please contact your pharmacy.  They will contact our office to request authorization. Prescriptions will not be filled after 5 pm or on week-ends. °3. You should follow a light diet the first 24 hours after arrival home, such as soup and crackers, etc.  Be sure to include lots of fluids daily.  Resume your normal diet the day after surgery. °4.Most patients will experience some swelling and bruising around the umbilicus or in the groin and scrotum.  Ice packs and reclining will help.  Swelling and bruising can take several days to resolve.  °6. It is common to experience some constipation if taking pain medication after surgery.  Increasing fluid intake and taking a stool softener (such as Colace) will usually help or prevent this problem from occurring.  A mild laxative (Milk of Magnesia or Miralax) should be taken according to package directions if there are no bowel movements after 48 hours. °7. Unless discharge instructions indicate otherwise, you may remove your bandages 24-48 hours after surgery, and you may shower at that time.  You may have steri-strips (small skin tapes) in place directly over the incision.  These strips should be left on the skin for 7-10 days.  If your surgeon used skin glue on the  incision, you may shower in 24 hours.  The glue will flake off over the next 2-3 weeks.  Any sutures or staples will be removed at the office during your follow-up visit. °8. ACTIVITIES:  You may resume regular (light) daily activities beginning the next day--such as daily self-care, walking, climbing stairs--gradually increasing activities as tolerated.  You may have sexual intercourse when it is comfortable.  Refrain from any heavy lifting or straining until approved by your doctor. ° °a.You may drive when you are no longer taking prescription pain medication, you can comfortably wear a seatbelt, and you can safely maneuver your car and apply brakes. °b.RETURN TO WORK:   °_____________________________________________ ° °9.You should see your doctor in the office for a follow-up appointment approximately 2-3 weeks after your surgery.  Make sure that you call for this appointment within a day or two after you arrive home to insure a convenient appointment time. °10.OTHER INSTRUCTIONS: __NO LIFTING MORE THAN 15 POUNDS FOR 4 WEEKS °OK TO SHOWER STARTING TOMORROW °ICE PACK, TYLENOL, IBUPROFEN ALSO FOR PAIN_______________________ °   _____________________________________ ° °WHEN TO CALL YOUR DOCTOR: °1. Fever over 101.0 °2. Inability to urinate °3. Nausea and/or vomiting °4. Extreme swelling or bruising °5. Continued bleeding from incision. °6. Increased pain, redness, or drainage from the incision ° °The clinic staff is available to answer your questions during regular business hours.  Please don’t hesitate to call and ask to speak to one of the nurses for clinical concerns.  If you have a medical emergency, go to the nearest emergency   room or call 911.  A surgeon from [facility] is always on call at the hospital ° ° °[address], [address], [place], [place]  [address] ? ° [address] [address], [place], [place]   [address] °[phone] ? [phone] ? FAX [phone] °Web site:  [email or web address] °

## 2016-08-06 NOTE — Transfer of Care (Signed)
Immediate Anesthesia Transfer of Care Note  Patient: Walter AmesFranklin D [name]  Procedure(s) Performed: Procedure(s): REPAIR UMBILICAL HERNIA WITH MESH (N/A) INSERTION OF MESH (N/A)  Patient Location: PACU  Anesthesia Type:General  Level of Consciousness: awake, alert , oriented and patient cooperative  Airway & Oxygen Therapy: Patient Spontanous Breathing and Patient connected to nasal cannula oxygen  Post-op Assessment: Report given to RN and Post -op Vital signs reviewed and stable  Post vital signs: Reviewed and stable  Last Vitals:  Vitals:   08/06/16 0707  BP: (!) 147/76  Pulse: 78  Resp: 18  Temp: 36.6 C    Last Pain:  Vitals:   08/06/16 0727  TempSrc:   PainSc: 6       Patients Stated Pain Goal: 2 (08/06/16 0727)  Complications: No apparent anesthesia complications

## 2016-08-06 NOTE — Anesthesia Postprocedure Evaluation (Signed)
Anesthesia Post Note  Patient: Walter [name]  Procedure(s) Performed: Procedure(s) (LRB): REPAIR UMBILICAL HERNIA WITH MESH (N/A) INSERTION OF MESH (N/A)     Patient location during evaluation: PACU Anesthesia Type: General Level of consciousness: awake and alert Pain management: pain level controlled Vital Signs Assessment: post-procedure vital signs reviewed and stable Respiratory status: spontaneous breathing, nonlabored ventilation, respiratory function stable and patient connected to nasal cannula oxygen Cardiovascular status: blood pressure returned to baseline and stable Postop Assessment: no signs of nausea or vomiting Anesthetic complications: no    Last Vitals:  Vitals:   08/06/16 1050 08/06/16 1105  BP: (!) 101/58 103/63  Pulse: 77 85  Resp: 11 15  Temp:  36.5 C    Last Pain:  Vitals:   08/06/16 1105  TempSrc:   PainSc: 3                  Ryan P Ellender

## 2016-08-06 NOTE — Progress Notes (Signed)
   08/06/16 0733  OBSTRUCTIVE SLEEP APNEA  Have you ever been diagnosed with sleep apnea through a sleep study? No  Do you snore loudly (loud enough to be heard through closed doors)?  1  Do you often feel tired, fatigued, or sleepy during the daytime (such as falling asleep during driving or talking to someone)? 0  Has anyone observed you stop breathing during your sleep? 1  Do you have, or are you being treated for high blood pressure? 1  BMI more than 35 kg/m2? 1  Age > 50 (1-yes) 0  Neck circumference greater than:Male 16 inches or larger, Male 17inches or larger? 1  Male Gender (Yes=1) 1  Obstructive Sleep Apnea Score 6

## 2016-08-06 NOTE — Op Note (Signed)
REPAIR UMBILICAL HERNIA WITH MESH, INSERTION OF MESH  Procedure Note  Francina AmesFranklin D Ida 08/06/2016   Pre-op Diagnosis: Umbilical hernia     Post-op Diagnosis: same  Procedure(s): REPAIR UMBILICAL HERNIA WITH MESH INSERTION OF MESH (6.4 cm round ventral patch from Bard)  Surgeon(s): Abigail MiyamotoBlackman, Ranyah Groeneveld, MD  Anesthesia: General  Staff:  Circulator: Islam, Jeanelle Mallingushdan M, RN Relief Circulator: Geraldo DockerFutch, Beverly M, RN Relief Scrub: Bud FacePerez, Xinia T Scrub Person: Teschner, Mindy K, CST  Estimated Blood Loss: Minimal               Findings: The patient was found to have an umbilical hernia containing incarcerated omentum. The fascial defect was small and were repaired with a 4.6 m round ventral Prolene patch from Bard  Procedure: The patient was brought to the operating room and identified as the correct patient. He was placed supine on the operating table and general anesthesia was induced. His abdomen was then prepped and draped in usual sterile fashion. I made a small vertical incision above the umbilicus and carried this down into the umbilicus with a scalpel. I then dissected down to the hernia sac with electrocautery. It was a large hernia sac which I separated from the overlying umbilical skin. I then opened the sac and found it contained only omentum. I was able to excise the sac and reduce all the omentum back into the abdominal cavity. All attachments were freed up around the fascial defect. I then brought a 6.4 cm round ventral patch onto the field. The fascial defect itself was about 1.5 cm in size. I placed the ventral patch through the opening in the fascia and pulled up against the peritoneum with the stay ties. I then sewed the mesh and circumferentially with multiple #1 Novafil sutures. I then cut the stay ties. I then closed the fascia over the top of the mesh with figure-of-eight #1 Novafil sutures well. Good closure of the fascia and wide coverage appeared to be achieved. I anesthetized  the fascia further and skin with Marcaine. Hemostasis appeared to be achieved. I then closed the subcutaneous tissue with 3-0 Vicryl sutures and closed the skin with a running 4-0 Monocryl. Skin glue was then applied. The patient tolerated the procedure well. All the counts were correct at the end of the procedure. The patient was then extubated in the operating room and taken in a stable condition to the recovery room.          Gracy Ehly A   Date: 08/06/2016  Time: 9:27 AM

## 2016-08-06 NOTE — Interval H&P Note (Signed)
History and Physical Interval Note: no change in H and P  08/06/2016 7:06 AM  Walter [name]  has presented today for surgery, with the diagnosis of Umbilical hernia  The various methods of treatment have been discussed with the patient and family. After consideration of risks, benefits and other options for treatment, the patient has consented to  Procedure(s): REPAIR UMBILICAL HERNIA WITH MESH (N/A) INSERTION OF MESH (N/A) as a surgical intervention .  The patient's history has been reviewed, patient examined, no change in status, stable for surgery.  I have reviewed the patient's chart and labs.  Questions were answered to the patient's satisfaction.     [name]

## 2016-08-07 ENCOUNTER — Encounter (HOSPITAL_COMMUNITY): Payer: Self-pay | Admitting: Surgery

## 2016-08-20 ENCOUNTER — Ambulatory Visit: Payer: Self-pay | Admitting: Family

## 2016-09-06 ENCOUNTER — Ambulatory Visit (INDEPENDENT_AMBULATORY_CARE_PROVIDER_SITE_OTHER): Payer: Medicaid Other | Admitting: Family

## 2016-09-06 ENCOUNTER — Encounter: Payer: Self-pay | Admitting: Family

## 2016-09-06 VITALS — BP 134/82 | HR 78 | Temp 98.0°F | Ht 71.0 in | Wt >= 6400 oz

## 2016-09-06 DIAGNOSIS — R5383 Other fatigue: Secondary | ICD-10-CM | POA: Diagnosis not present

## 2016-09-06 DIAGNOSIS — Z6841 Body Mass Index (BMI) 40.0 and over, adult: Secondary | ICD-10-CM | POA: Diagnosis not present

## 2016-09-06 DIAGNOSIS — R0683 Snoring: Secondary | ICD-10-CM | POA: Diagnosis not present

## 2016-09-06 DIAGNOSIS — Z09 Encounter for follow-up examination after completed treatment for conditions other than malignant neoplasm: Secondary | ICD-10-CM

## 2016-09-06 DIAGNOSIS — Z5189 Encounter for other specified aftercare: Secondary | ICD-10-CM | POA: Diagnosis not present

## 2016-09-06 DIAGNOSIS — T814XXD Infection following a procedure, subsequent encounter: Secondary | ICD-10-CM | POA: Diagnosis not present

## 2016-09-06 DIAGNOSIS — IMO0001 Reserved for inherently not codable concepts without codable children: Secondary | ICD-10-CM

## 2016-09-06 MED ORDER — OXYCODONE HCL 5 MG PO TABS: 5.0000 mg | ORAL_TABLET | ORAL | 0 refills | Status: DC | PRN

## 2016-09-06 NOTE — Progress Notes (Signed)
Subjective:    Patient ID: Walter [name], male    DOB: [birth year], 51 y.o.   MRN: [id number]   HPI PT presents to the office today for wound recheck and sleep apnea. Pt had hernia repair on 08/06/16 and [facility] for an abscess and was discharge on 09/03/16. PT is currently taking Bactrim, but only started yesterday. He is doing wet-to- dry dressing BID and states he has a yellow discharge present.   Had appt with Surgeon on 09/03/16 and had follow up in 2 weeks.   PT states he snores and "quits breathing while sleeping" for years. Reports never feeling rested and pt has BMI of 57.43.  He has never been tested for sleep apnea, but would like to.   [facility]*Hospital notes reviewed  Review of Systems  Skin: Positive for wound.  All other systems reviewed and are negative.      Objective:   Physical Exam  Constitutional: He is oriented to person, place, and time. He appears well-developed and well-nourished. No distress.  Morbid obese  HENT:  Head: Normocephalic.  Cardiovascular: Normal rate, regular rhythm, normal heart sounds and intact distal pulses.   No murmur heard. Pulmonary/Chest: Effort normal and breath sounds normal. No respiratory distress. He has no wheezes.  Abdominal: Soft. Bowel sounds are normal. He exhibits no distension. There is no tenderness.  Musculoskeletal: Normal range of motion. He exhibits no edema or tenderness.  Neurological: He is alert and oriented to person, place, and time.  Skin: Skin is warm and dry. No rash noted. No erythema.  1.6 cm wound with purulent discharge, wet to dry dressing present, tenderness   Psychiatric: He has a normal mood and affect. His behavior is normal. Judgment and thought content normal.  Vitals reviewed.       BP 134/82   Pulse 78   Temp 98 F (36.7 C) (Oral)   Ht 5\' 11"  (1.803 m)   Wt (!) 411 lb 12.8 oz (186.8 kg)   BMI 57.43 kg/m      Assessment & Plan:  1. Encounter for wound  re-check - oxyCODONE (OXY IR/ROXICODONE) 5 MG immediate release tablet; Take 1-2 tablets (5-10 mg total) by mouth every 4 (four) hours as needed for moderate pain, severe pain or breakthrough pain.  Dispense: 30 tablet; Refill: 0 - CBC with Differential/Platelet  2. Snoring - Ambulatory referral to Pulmonology  3. Morbid obesity with BMI of 50.0-59.9, adult [facility](HCC) - Ambulatory referral to Pulmonology  4. Abscess of postoperative wound of abdominal wall, subsequent encounter - oxyCODONE (OXY IR/ROXICODONE) 5 MG immediate release tablet; Take 1-2 tablets (5-10 mg total) by mouth every 4 (four) hours as needed for moderate pain, severe pain or breakthrough pain.  Dispense: 30 tablet; Refill: 0 - CBC with Differential/Platelet  5. Fatigue, unspecified type  - Ambulatory referral to Pulmonology  6. Hospital discharge follow-up - CBC with Differential/Platelet   Continue Bactrim and BID wet-to-dry dressing changes PT reviewed in [place] controlled Database- Pt has received Oxycodone 5 mg #30 on 07/16, 07/23 and 08/13 Pt requesting refill today, because he does not want to drive [place]. I discussed I will give him oxycodone 5 mg #30 today, but that this is all I could give him. Pt is followed at methadone clinic.  Report any fevers, increased tenderness, or increase discharge Keep follow up with surgeon  Will do referral to Pulmonary for testing for OSA Encouraged weight loss  Jannifer Rodneyhristy Lehman Whiteley, FNP

## 2016-09-06 NOTE — Patient Instructions (Signed)

## 2016-09-07 LAB — CBC WITH DIFFERENTIAL/PLATELET
Basophils Absolute: 0 x10E3/uL (ref 0.0–0.2)
Basos: 0 %
EOS (ABSOLUTE): 0.3 x10E3/uL (ref 0.0–0.4)
Eos: 4 %
Hematocrit: 43.5 % (ref 37.5–51.0)
Hemoglobin: 14.2 g/dL (ref 13.0–17.7)
Immature Grans (Abs): 0 x10E3/uL (ref 0.0–0.1)
Immature Granulocytes: 0 %
Lymphocytes Absolute: 1.9 x10E3/uL (ref 0.7–3.1)
Lymphs: 26 %
MCH: 27.9 pg (ref 26.6–33.0)
MCHC: 32.6 g/dL (ref 31.5–35.7)
MCV: 86 fL (ref 79–97)
Monocytes Absolute: 0.7 x10E3/uL (ref 0.1–0.9)
Monocytes: 10 %
Neutrophils Absolute: 4.4 x10E3/uL (ref 1.4–7.0)
Neutrophils: 60 %
Platelets: 270 x10E3/uL (ref 150–379)
RBC: 5.09 x10E6/uL (ref 4.14–5.80)
RDW: 15.3 % (ref 12.3–15.4)
WBC: 7.3 x10E3/uL (ref 3.4–10.8)

## 2016-09-20 ENCOUNTER — Ambulatory Visit: Payer: Self-pay | Admitting: Family

## 2016-09-28 ENCOUNTER — Telehealth: Payer: Self-pay | Admitting: Family

## 2016-10-31 ENCOUNTER — Other Ambulatory Visit: Payer: Self-pay | Admitting: *Deleted

## 2016-10-31 DIAGNOSIS — E349 Endocrine disorder, unspecified: Secondary | ICD-10-CM

## 2016-10-31 DIAGNOSIS — N529 Male erectile dysfunction, unspecified: Secondary | ICD-10-CM

## 2016-10-31 MED ORDER — TESTOSTERONE CYPIONATE 200 MG/ML IM KIT: 200.0000 mg | PACK | INTRAMUSCULAR | 1 refills | Status: DC

## 2016-11-01 NOTE — Telephone Encounter (Signed)
Testosterone called to [facility] pharmacy.

## 2017-01-02 ENCOUNTER — Institutional Professional Consult (permissible substitution): Payer: Self-pay | Admitting: Pulmonary Disease

## 2017-01-18 ENCOUNTER — Telehealth: Payer: Self-pay | Admitting: Family

## 2017-01-18 MED ORDER — NYSTATIN 100000 UNIT/ML MT SUSP
5.0000 mL | Freq: Four times a day (QID) | OROMUCOSAL | 0 refills | Status: DC
Start: 2017-01-18 — End: 2017-08-29

## 2017-01-18 NOTE — Telephone Encounter (Signed)
Nystatin  Prescription sent to pharmacy   

## 2017-01-18 NOTE — Telephone Encounter (Signed)
What symptoms do you have? Thrush, has white film on tongue that burns   How long have you been sick? About 10 days  Have you been seen for this problem? No, he has tried yogurt and gargling with salt water   If your provider decides to give you a prescription, which pharmacy would you like for it to be sent to? [facility]   Patient informed that this information will be sent to the clinical staff for review and that they should receive a follow up call.

## 2017-01-18 NOTE — Telephone Encounter (Signed)
Pt aware rx sent into pharmacy. 

## 2017-02-20 ENCOUNTER — Institutional Professional Consult (permissible substitution): Payer: Self-pay | Admitting: Pulmonary Disease

## 2017-03-19 ENCOUNTER — Other Ambulatory Visit: Payer: Self-pay | Admitting: *Deleted

## 2017-03-19 DIAGNOSIS — E349 Endocrine disorder, unspecified: Secondary | ICD-10-CM

## 2017-03-19 DIAGNOSIS — N529 Male erectile dysfunction, unspecified: Secondary | ICD-10-CM

## 2017-03-26 ENCOUNTER — Institutional Professional Consult (permissible substitution): Payer: Self-pay | Admitting: Pulmonary Disease

## 2017-04-23 ENCOUNTER — Ambulatory Visit: Payer: Self-pay | Admitting: Family

## 2017-04-24 ENCOUNTER — Encounter: Payer: Self-pay | Admitting: Family

## 2017-05-14 ENCOUNTER — Institutional Professional Consult (permissible substitution): Payer: Medicaid Other | Admitting: Pulmonary Disease

## 2017-07-17 ENCOUNTER — Encounter: Payer: Self-pay | Admitting: Family

## 2017-07-23 ENCOUNTER — Ambulatory Visit: Payer: Self-pay | Admitting: Family

## 2017-07-29 ENCOUNTER — Other Ambulatory Visit: Payer: Self-pay | Admitting: Family

## 2017-07-30 ENCOUNTER — Encounter: Payer: Self-pay | Admitting: Family

## 2017-08-20 ENCOUNTER — Ambulatory Visit: Payer: Self-pay | Admitting: Family

## 2017-08-29 ENCOUNTER — Ambulatory Visit: Payer: Medicaid Other | Admitting: Family

## 2017-08-29 ENCOUNTER — Ambulatory Visit: Payer: Medicaid Other

## 2017-08-29 ENCOUNTER — Encounter: Payer: Self-pay | Admitting: Family

## 2017-08-29 VITALS — BP 165/92 | HR 75 | Temp 97.5°F | Ht 71.0 in | Wt >= 6400 oz

## 2017-08-29 DIAGNOSIS — M545 Low back pain: Secondary | ICD-10-CM | POA: Diagnosis not present

## 2017-08-29 DIAGNOSIS — Z6841 Body Mass Index (BMI) 40.0 and over, adult: Secondary | ICD-10-CM

## 2017-08-29 DIAGNOSIS — G8929 Other chronic pain: Secondary | ICD-10-CM

## 2017-08-29 DIAGNOSIS — I10 Essential (primary) hypertension: Secondary | ICD-10-CM | POA: Diagnosis not present

## 2017-08-29 DIAGNOSIS — E349 Endocrine disorder, unspecified: Secondary | ICD-10-CM | POA: Diagnosis not present

## 2017-08-29 MED ORDER — CYCLOBENZAPRINE HCL 10 MG PO TABS: 10.0000 mg | ORAL_TABLET | Freq: Three times a day (TID) | ORAL | 0 refills | Status: DC | PRN

## 2017-08-29 MED ORDER — PREDNISONE 10 MG (21) PO TBPK: ORAL_TABLET | ORAL | 0 refills | Status: DC

## 2017-08-29 MED ORDER — LISINOPRIL-HYDROCHLOROTHIAZIDE 10-12.5 MG PO TABS: ORAL_TABLET | ORAL | 3 refills | Status: DC

## 2017-08-29 MED ORDER — GABAPENTIN 300 MG PO CAPS: 300.0000 mg | ORAL_CAPSULE | Freq: Three times a day (TID) | ORAL | 3 refills | Status: DC

## 2017-08-29 NOTE — Patient Instructions (Signed)

## 2017-08-29 NOTE — Progress Notes (Signed)
Subjective:    Patient ID: Walter [name], male    DOB: 04/22/1965, 52 y.o.   MRN: [id number]  Chief Complaint  Patient presents with  . Back Pain  . Swelling in feet   PT presents to the office today for back pain that is worsening. He is followed by [facility]. PT's BP is elevated today, but states his pain is a 10 out 10.  Back Pain  This is a chronic problem. The current episode started more than 1 year ago. The problem occurs constantly. The problem has been gradually worsening since onset. The pain is present in the lumbar spine. The quality of the pain is described as aching. The pain is at a severity of 10/10. The pain is moderate. The symptoms are aggravated by bending, twisting and sitting. Associated symptoms include leg pain, numbness, tingling and weakness. Pertinent negatives include no bladder incontinence, bowel incontinence or headaches. Risk factors include obesity. He has tried analgesics, bed rest and NSAIDs for the symptoms. The treatment provided mild relief.  Hypertension  This is a chronic problem. The current episode started more than 1 year ago. The problem is unchanged. The problem is uncontrolled. Associated symptoms include malaise/fatigue and peripheral edema. Pertinent negatives include no headaches. The current treatment provides mild improvement. There is no history of kidney disease, CAD/MI, CVA or heart failure.  Testosterone Deficiency  PT has taken testosterone injections that have helped.    Review of Systems  Constitutional: Positive for malaise/fatigue.  Gastrointestinal: Negative for bowel incontinence.  Genitourinary: Negative for bladder incontinence.  Musculoskeletal: Positive for back pain.  Neurological: Positive for tingling, weakness and numbness. Negative for headaches.  All other systems reviewed and are negative.      Objective:   Physical Exam  Constitutional: He is oriented to person, place, and time. He appears  well-developed and well-nourished. No distress.  Morbid obesity   HENT:  Head: Normocephalic.  Right Ear: External ear normal.  Left Ear: External ear normal.  Mouth/Throat: Oropharynx is clear and moist.  Eyes: Pupils are equal, round, and reactive to light. Right eye exhibits no discharge. Left eye exhibits no discharge.  Neck: Normal range of motion. Neck supple. No thyromegaly present.  Cardiovascular: Normal rate, regular rhythm, normal heart sounds and intact distal pulses.  No murmur heard. Pulmonary/Chest: Effort normal and breath sounds normal. No respiratory distress. He has no wheezes.  Abdominal: Soft. Bowel sounds are normal. He exhibits no distension. There is no tenderness.  Musculoskeletal: He exhibits tenderness. He exhibits no edema.  Pain in lower lumbar, pain with flexion and extension. Pt unable to perform SLR  Neurological: He is alert and oriented to person, place, and time. He has normal reflexes. No cranial nerve deficit.  Skin: Skin is warm and dry. No rash noted. No erythema.  Psychiatric: He has a normal mood and affect. His behavior is normal. Judgment and thought content normal.  Vitals reviewed.     BP (!) 165/92   Pulse 75   Temp (!) 97.5 F (36.4 C) (Oral)   Ht _0  (1.803 m)   Wt (!) 408 lb (185.1 kg)   BMI 56.90 kg/m      Assessment & Plan:  [name] comes in today with chief complaint of Back Pain and Swelling in feet   Diagnosis and orders addressed:  1. Chronic bilateral low back pain, with sciatica presence unspecified Will order MRI today ROM exercises encouraged Weight loss encouraged  Will start gabapentin,  discussed starting a night and may causes drowsiness  - DG Lumbar Spine 2-3 Views; Future - BMP8+EGFR - predniSONE (STERAPRED UNI-PAK 21 TAB) 10 MG (21) TBPK tablet; Use as directed  Dispense: 21 tablet; Refill: 0 - cyclobenzaprine (FLEXERIL) 10 MG tablet; Take 1 tablet (10 mg total) by mouth 3 (three) times daily  as needed for muscle spasms.  Dispense: 30 tablet; Refill: 0 - gabapentin (NEURONTIN) 300 MG capsule; Take 1 capsule (300 mg total) by mouth 3 (three) times daily.  Dispense: 90 capsule; Refill: 3 - MR Lumbar Spine Wo Contrast; Future  2. Morbid obesity with BMI of 50.0-59.9, adult (HCC) - BMP8+EGFR  3. Essential hypertension PT will monitor BP at home - BMP8+EGFR - lisinopril-hydrochlorothiazide (PRINZIDE,ZESTORETIC) 10-12.5 MG tablet; TAKE (1) TABLET BY MOUTH ONCE DAILY.  Dispense: 90 tablet; Refill: 3  4. Low back pain, unspecified back pain laterality, unspecified chronicity, with sciatica presence unspecified - Urinalysis, Complete  5. Testosterone deficiency - Ambulatory referral to Endocrinology   Labs pending Health Maintenance reviewed Diet and exercise encouraged  Follow up plan: Will recheck in 1 month for HTN   Evelina Dun, FNP

## 2017-08-30 LAB — BMP8+EGFR
BUN / CREAT RATIO: 13 (ref 9–20)
BUN: 10 mg/dL (ref 6–24)
CO2: 30 mmol/L — ABNORMAL HIGH (ref 20–29)
CREATININE: 0.76 mg/dL (ref 0.76–1.27)
Calcium: 8.9 mg/dL (ref 8.7–10.2)
Chloride: 98 mmol/L (ref 96–106)
GFR calc non Af Amer: 106 mL/min/{1.73_m2} (ref >59)
GFR, EST AFRICAN AMERICAN: 123 mL/min/{1.73_m2} (ref >59)
Glucose: 107 mg/dL — ABNORMAL HIGH (ref 65–99)
Potassium: 3.9 mmol/L (ref 3.5–5.2)
SODIUM: 140 mmol/L (ref 134–144)

## 2017-09-02 LAB — URINALYSIS, COMPLETE
Bilirubin, UA: NEGATIVE
GLUCOSE, UA: NEGATIVE
Ketones, UA: NEGATIVE
LEUKOCYTES UA: NEGATIVE
Nitrite, UA: NEGATIVE
PROTEIN UA: NEGATIVE
RBC, UA: NEGATIVE
SPEC GRAV UA: 1.02 (ref 1.005–1.030)
Urobilinogen, Ur: 1 mg/dL (ref 0.2–1.0)
pH, UA: 7 (ref 5.0–7.5)

## 2017-09-02 LAB — MICROSCOPIC EXAMINATION
BACTERIA UA: NONE SEEN
EPITHELIAL CELLS (NON RENAL): NONE SEEN /HPF (ref 0–10)
RBC MICROSCOPIC, UA: NONE SEEN /HPF (ref 0–2)
RENAL EPITHEL UA: NONE SEEN /HPF
WBC, UA: NONE SEEN /hpf (ref 0–5)

## 2017-09-03 ENCOUNTER — Telehealth: Payer: Self-pay | Admitting: *Deleted

## 2017-09-03 NOTE — Telephone Encounter (Signed)
Patient aware, must have six weeks of documented back problems for insurance to possibly cover an MRI. Referral was denied.

## 2017-11-27 ENCOUNTER — Encounter: Payer: Self-pay | Admitting: Endocrinology

## 2018-02-21 ENCOUNTER — Ambulatory Visit: Payer: Medicaid Other | Admitting: Family

## 2018-02-27 ENCOUNTER — Telehealth: Payer: Self-pay | Admitting: Family

## 2018-02-27 ENCOUNTER — Encounter: Payer: Self-pay | Admitting: Family

## 2018-02-27 NOTE — Telephone Encounter (Signed)
ERROR

## 2018-04-15 ENCOUNTER — Encounter: Payer: Medicaid Other | Admitting: Family

## 2018-06-11 ENCOUNTER — Telehealth: Payer: Self-pay | Admitting: Family

## 2018-06-11 DIAGNOSIS — I10 Essential (primary) hypertension: Secondary | ICD-10-CM

## 2018-06-11 MED ORDER — LISINOPRIL-HYDROCHLOROTHIAZIDE 10-12.5 MG PO TABS: ORAL_TABLET | ORAL | 0 refills | Status: DC

## 2018-06-11 NOTE — Telephone Encounter (Signed)
What is the name of the medication? Lisinopril/HCTZ-- has first avalible appt scheduled with pcp   Have you contacted your pharmacy to request a refill? Yes  Which pharmacy would you like this sent to? [facility]   Patient notified that their request is being sent to the clinical staff for review and that they should receive a call once it is complete. If they do not receive a call within 24 hours they can check with their pharmacy or our office.

## 2018-06-11 NOTE — Telephone Encounter (Signed)
Pt aware refill sent to pharmacy 

## 2018-06-24 ENCOUNTER — Other Ambulatory Visit: Payer: Self-pay

## 2018-06-25 ENCOUNTER — Ambulatory Visit: Payer: Medicaid Other | Admitting: Family

## 2018-07-25 ENCOUNTER — Other Ambulatory Visit: Payer: Self-pay | Admitting: Family

## 2018-07-25 DIAGNOSIS — I10 Essential (primary) hypertension: Secondary | ICD-10-CM

## 2018-07-25 NOTE — Telephone Encounter (Signed)
Patient needs blood work. One month refill only.

## 2018-08-14 ENCOUNTER — Ambulatory Visit: Payer: Medicaid Other | Admitting: Family

## 2018-09-04 ENCOUNTER — Encounter: Payer: Self-pay | Admitting: Family

## 2018-09-04 ENCOUNTER — Ambulatory Visit (INDEPENDENT_AMBULATORY_CARE_PROVIDER_SITE_OTHER): Payer: Medicaid Other | Admitting: Family

## 2018-09-04 DIAGNOSIS — I10 Essential (primary) hypertension: Secondary | ICD-10-CM | POA: Diagnosis not present

## 2018-09-04 DIAGNOSIS — M5442 Lumbago with sciatica, left side: Secondary | ICD-10-CM | POA: Diagnosis not present

## 2018-09-04 DIAGNOSIS — Z6841 Body Mass Index (BMI) 40.0 and over, adult: Secondary | ICD-10-CM

## 2018-09-04 DIAGNOSIS — G8929 Other chronic pain: Secondary | ICD-10-CM

## 2018-09-04 DIAGNOSIS — M5441 Lumbago with sciatica, right side: Secondary | ICD-10-CM

## 2018-09-04 MED ORDER — LISINOPRIL-HYDROCHLOROTHIAZIDE 10-12.5 MG PO TABS: ORAL_TABLET | ORAL | 1 refills | Status: DC

## 2018-09-04 NOTE — Progress Notes (Signed)
Virtual Visit via telephone Note Due to COVID-19 pandemic this visit was conducted virtually. This visit type was conducted due to national recommendations for restrictions regarding the COVID-19 Pandemic (e.g. social distancing, sheltering in place) in an effort to limit this patient's exposure and mitigate transmission in our community. All issues noted in this document were discussed and addressed.  A physical exam was not performed with this format.  I connected with Walter [name] on 09/04/18 at 10:52 AM by telephone and verified that I am speaking with the correct person using two identifiers. Walter [name] is currently located at home  and no one is currently with her during visit. The provider, Evelina Dun, FNP is located in their office at time of visit.  I discussed the limitations, risks, security and privacy concerns of performing an evaluation and management service by telephone and the availability of in person appointments. I also discussed with the patient that there may be a patient responsible charge related to this service. The patient expressed understanding and agreed to proceed.   History and Present Illness:  PT presents to the office today for chronic follow up. [facility]   Hypertension This is a chronic problem. The current episode started more than 1 year ago. The problem has been waxing and waning since onset. The problem is uncontrolled. Associated symptoms include headaches, malaise/fatigue and peripheral edema. Pertinent negatives include no chest pain or shortness of breath. Risk factors for coronary artery disease include obesity and sedentary lifestyle. Treatments tried: has been out of his medication for the last week. There is no history of kidney disease, CAD/MI or heart failure.  Back Pain This is a chronic problem. The current episode started more than 1 year ago. The problem occurs constantly. The problem has been waxing  and waning since onset. The pain is present in the lumbar spine. The quality of the pain is described as aching. The pain is at a severity of 9/10. The pain is moderate. The symptoms are aggravated by standing. Associated symptoms include headaches, leg pain and weakness. Pertinent negatives include no bladder incontinence, bowel incontinence or chest pain. He has tried analgesics, bed rest and muscle relaxant for the symptoms. The treatment provided mild relief.      Review of Systems  Constitutional: Positive for malaise/fatigue.  Respiratory: Negative for shortness of breath.   Cardiovascular: Negative for chest pain.  Gastrointestinal: Negative for bowel incontinence.  Genitourinary: Negative for bladder incontinence.  Musculoskeletal: Positive for back pain.  Neurological: Positive for weakness and headaches.  All other systems reviewed and are negative.    Observations/Objective: No SOB or distress noted   Assessment and Plan: 1. Essential hypertension Pt will restart BP medication today -Dash diet information given -Exercise encouraged - Stress Management  -Continue current meds -RTO in 2 weeks  - CMP14+EGFR - lisinopril-hydrochlorothiazide (ZESTORETIC) 10-12.5 MG tablet; TAKE 1 TABLET BY MOUTH ONCE DAILY.  Dispense: 90 tablet; Refill: 1  2. Morbid obesity with BMI of 50.0-59.9, adult ([place]) - CMP14+EGFR  3. Chronic bilateral low back pain with bilateral sciatica Will do referral to Ortho - CMP14+EGFR - Ambulatory referral to Orthopedic Surgery    I discussed the assessment and treatment plan with the patient. The patient was provided an opportunity to ask questions and all were answered. The patient agreed with the plan and demonstrated an understanding of the instructions.   The patient was advised to call back or seek an in-person evaluation if  the symptoms worsen or if the condition fails to improve as anticipated.  The above assessment and management plan was  discussed with the patient. The patient verbalized understanding of and has agreed to the management plan. Patient is aware to call the clinic if symptoms persist or worsen. Patient is aware when to return to the clinic for a follow-up visit. Patient educated on when it is appropriate to go to the emergency department.   Time call ended:11:10 AM     I provided 18 minutes of non-face-to-face time during this encounter.    Evelina Dun, FNP

## 2018-09-17 ENCOUNTER — Other Ambulatory Visit: Payer: Self-pay

## 2018-09-18 ENCOUNTER — Ambulatory Visit (INDEPENDENT_AMBULATORY_CARE_PROVIDER_SITE_OTHER): Payer: Medicaid Other | Admitting: Family

## 2018-09-18 ENCOUNTER — Encounter: Payer: Self-pay | Admitting: Family

## 2018-09-18 VITALS — BP 155/89 | HR 69 | Temp 96.0°F | Ht 70.0 in | Wt >= 6400 oz

## 2018-09-18 DIAGNOSIS — I10 Essential (primary) hypertension: Secondary | ICD-10-CM

## 2018-09-18 DIAGNOSIS — R5383 Other fatigue: Secondary | ICD-10-CM

## 2018-09-18 DIAGNOSIS — M5442 Lumbago with sciatica, left side: Secondary | ICD-10-CM

## 2018-09-18 DIAGNOSIS — G8929 Other chronic pain: Secondary | ICD-10-CM

## 2018-09-18 DIAGNOSIS — Z6841 Body Mass Index (BMI) 40.0 and over, adult: Secondary | ICD-10-CM

## 2018-09-18 DIAGNOSIS — M5441 Lumbago with sciatica, right side: Secondary | ICD-10-CM

## 2018-09-18 DIAGNOSIS — R0602 Shortness of breath: Secondary | ICD-10-CM

## 2018-09-18 MED ORDER — HYDROCODONE-ACETAMINOPHEN 7.5-325 MG PO TABS: 1.0000 | ORAL_TABLET | Freq: Two times a day (BID) | ORAL | 0 refills | Status: DC | PRN

## 2018-09-18 NOTE — Patient Instructions (Signed)

## 2018-09-18 NOTE — Progress Notes (Signed)
Subjective:    Patient ID: Walter [name], male    DOB: [birth year], 53 y.o.   MRN: [id number]  Chief Complaint  Patient presents with  . Medical Management of Chronic Issues   PT presents to the office today for chronic follow up. [facility].  Hypertension This is a chronic problem. The current episode started more than 1 year ago. The problem has been waxing and waning since onset. The problem is uncontrolled. Associated symptoms include malaise/fatigue and peripheral edema. Pertinent negatives include no headaches. Risk factors for coronary artery disease include dyslipidemia, obesity, male gender and sedentary lifestyle. The current treatment provides moderate improvement. There is no history of kidney disease, CAD/MI or heart failure.  Back Pain This is a chronic problem. The current episode started more than 1 year ago. The problem occurs intermittently. The problem has been waxing and waning since onset. The pain is present in the lumbar spine. The quality of the pain is described as aching. The pain is at a severity of 10/10. The pain is moderate. The symptoms are aggravated by standing, sitting and twisting. Associated symptoms include leg pain. Pertinent negatives include no bladder incontinence, bowel incontinence or headaches. Risk factors include obesity. He has tried muscle relaxant and NSAIDs for the symptoms. The treatment provided mild relief.      Review of Systems  Constitutional: Positive for malaise/fatigue.  Gastrointestinal: Negative for bowel incontinence.  Genitourinary: Negative for bladder incontinence.  Musculoskeletal: Positive for back pain.  Neurological: Negative for headaches.  All other systems reviewed and are negative.      Objective:   Physical Exam Vitals signs reviewed.  Constitutional:      General: He is not in acute distress.    Appearance: He is well-developed. He is obese.  HENT:     Head: Normocephalic.   Right Ear: Tympanic membrane normal.     Left Ear: Tympanic membrane normal.  Eyes:     General:        Right eye: No discharge.        Left eye: No discharge.     Pupils: Pupils are equal, round, and reactive to light.  Neck:     Musculoskeletal: Normal range of motion and neck supple.     Thyroid: No thyromegaly.  Cardiovascular:     Rate and Rhythm: Normal rate and regular rhythm.     Heart sounds: Normal heart sounds. No murmur.  Pulmonary:     Effort: Pulmonary effort is normal. No respiratory distress.     Breath sounds: Normal breath sounds. No wheezing.  Abdominal:     General: Bowel sounds are normal. There is no distension.     Palpations: Abdomen is soft.     Tenderness: There is no abdominal tenderness.  Musculoskeletal:        General: No tenderness.     Comments: Pain in lower lumbar with flexion and extension  Skin:    General: Skin is warm and dry.     Coloration: Skin is pale.     Findings: No erythema or rash.  Neurological:     Mental Status: He is alert and oriented to person, place, and time.     Cranial Nerves: No cranial nerve deficit.     Deep Tendon Reflexes: Reflexes are normal and symmetric.  Psychiatric:        Behavior: Behavior normal.        Thought Content: Thought content normal.  Judgment: Judgment normal.       BP (!) 155/89   Pulse 69   Temp (!) 96 F (35.6 C) (Temporal)   Ht _0  (1.778 m)   Wt (!) 406 lb 9.6 oz (184.4 kg)   BMI 58.34 kg/m      Assessment & Plan:  Walter [name] comes in today with chief complaint of Medical Management of Chronic Issues   Diagnosis and orders addressed:  1. Essential hypertension - CMP14+EGFR - Amb Referral to Bariatric Surgery  2. Chronic bilateral low back pain with bilateral sciatica We will given Norco for one month Long discussion with patient that this could not be long term and he would need to follow up with Ortho. If pain does not improve we will send to pain clinic  - CMP14+EGFR - Anemia Profile B - HYDROcodone-acetaminophen (NORCO) 7.5-325 MG tablet; Take 1 tablet by mouth every 12 (twelve) hours as needed for moderate pain.  Dispense: 60 tablet; Refill: 0 - Amb Referral to Bariatric Surgery - Ambulatory referral to Cardiology  3. Morbid obesity with BMI of 50.0-59.9, adult (HCC) - CMP14+EGFR - Anemia Profile B - Amb Referral to Bariatric Surgery - Ambulatory referral to Cardiology  4. Other fatigue - CMP14+EGFR - Anemia Profile B - TSH - EKG 12-Lead - Amb Referral to Bariatric Surgery - Ambulatory referral to Cardiology  5. SOB (shortness of breath) - EKG 12-Lead - Amb Referral to Bariatric Surgery - Ambulatory referral to Cardiology   Labs pending Pt reviewed in [place] controlled database- I do not see methadone on this and I am unsure why? Health Maintenance reviewed Diet and exercise encouraged  Follow up plan: 1 months    Evelina Dun, FNP

## 2018-10-20 ENCOUNTER — Telehealth: Payer: Self-pay | Admitting: Family

## 2018-10-20 NOTE — Telephone Encounter (Signed)
Should be on his MyChart. I am not sure why it is not showing up.

## 2018-10-20 NOTE — Telephone Encounter (Signed)
Requested copy of EKG be printed for him to pick up- placed up front.

## 2018-10-21 ENCOUNTER — Encounter: Payer: Self-pay | Admitting: Cardiology

## 2018-10-21 DIAGNOSIS — R0602 Shortness of breath: Secondary | ICD-10-CM | POA: Insufficient documentation

## 2018-10-21 NOTE — Progress Notes (Deleted)
Cardiology Office Note   Date:  10/21/2018   ID:  Walter [name], DOB 09/29/1965, MRN [id number]  PCP:  Sharion Balloon, FNP  Cardiologist:   No primary care provider on file. Referring:  ***  No chief complaint on file.     History of Present Illness: Walter [name] is a 53 y.o. male  Is referred by *** for evaluation of SOB.  ***   Past Medical History:  Diagnosis Date  . AC (acromioclavicular) joint bone spurs   . Arthritis   . Attention deficit disorder   . Bipolar 1 disorder ([place])   . Chronic pain   . Degenerative disc disease, lumbar   . Family history of adverse reaction to anesthesia    Pt son is combative " it runs in the family"  . Headache   . History of drug dependence ([place])    opiates  . Hypertension   . Spinal stenosis   . Umbilical hernia     Past Surgical History:  Procedure Laterality Date  . CYSTECTOMY    . INSERTION OF MESH N/A 08/06/2016   Procedure: INSERTION OF MESH;  Surgeon: Coralie Keens, MD;  Location: [place];  Service: General;  Laterality: N/A;  . MULTIPLE TOOTH EXTRACTIONS    . UMBILICAL HERNIA REPAIR N/A 08/06/2016   Procedure: REPAIR UMBILICAL HERNIA WITH MESH;  Surgeon: Coralie Keens, MD;  Location: [facility];  Service: General;  Laterality: N/A;     Current Outpatient Medications  Medication Sig Dispense Refill  . gabapentin (NEURONTIN) 300 MG capsule Take 1 capsule (300 mg total) by mouth 3 (three) times daily. (Patient not taking: Reported on 09/18/2018) 90 capsule 3  . HYDROcodone-acetaminophen (NORCO) 7.5-325 MG tablet Take 1 tablet by mouth every 12 (twelve) hours as needed for moderate pain. 60 tablet 0  . lisinopril-hydrochlorothiazide (ZESTORETIC) 10-12.5 MG tablet TAKE 1 TABLET BY MOUTH ONCE DAILY. 90 tablet 1  . METHADONE HCL PO Take 120 mg by mouth daily.     . Testosterone Cypionate 200 MG/ML KIT Inject 200 mg into the muscle every 14 (fourteen) days. (Patient not taking: Reported on 09/18/2018) 6 kit 1   No  current facility-administered medications for this visit.     Allergies:   No known allergies    Social History:  The patient  reports that he has never smoked. He has never used smokeless tobacco. He reports that he does not drink alcohol or use drugs.   Family History:  The patient's ***family history includes Healthy in his brother, brother, mother, and sister.    ROS:  Please see the history of present illness.   Otherwise, review of systems are positive for {NONE DEFAULTED:18576::"none"}.   All other systems are reviewed and negative.    PHYSICAL EXAM: VS:  There were no vitals taken for this visit. , BMI There is no height or weight on file to calculate BMI. GENERAL:  Well appearing HEENT:  Pupils equal round and reactive, fundi not visualized, oral mucosa unremarkable NECK:  No jugular venous distention, waveform within normal limits, carotid upstroke brisk and symmetric, no bruits, no thyromegaly LYMPHATICS:  No cervical, inguinal adenopathy LUNGS:  Clear to auscultation bilaterally BACK:  No CVA tenderness CHEST:  Unremarkable HEART:  PMI not displaced or sustained,S1 and S2 within normal limits, no S3, no S4, no clicks, no rubs, *** murmurs ABD:  Flat, positive bowel sounds normal in frequency in pitch, no bruits, no rebound, no guarding, no midline pulsatile mass,  no hepatomegaly, no splenomegaly EXT:  2 plus pulses throughout, no edema, no cyanosis no clubbing SKIN:  No rashes no nodules NEURO:  Cranial nerves II through XII grossly intact, motor grossly intact throughout PSYCH:  Cognitively intact, oriented to person place and time    EKG:  EKG {ACTION; IS/IS GAY:84720721} ordered today. The ekg ordered today demonstrates ***   Recent Labs: No results found for requested labs within last 8760 hours.    Lipid Panel    Component Value Date/Time   CHOL 154 01/09/2016 1010   TRIG 134 01/09/2016 1010   HDL 33 (L) 01/09/2016 1010   CHOLHDL 4.7 01/09/2016 1010    LDLCALC 94 01/09/2016 1010      Wt Readings from Last 3 Encounters:  09/18/18 (!) 406 lb 9.6 oz (184.4 kg)  08/29/17 (!) 408 lb (185.1 kg)  09/06/16 (!) 411 lb 12.8 oz (186.8 kg)      Other studies Reviewed: Additional studies/ records that were reviewed today include: ***. Review of the above records demonstrates:  Please see elsewhere in the note.  ***   ASSESSMENT AND PLAN:  SOB:  ***   Current medicines are reviewed at length with the patient today.  The patient {ACTIONS; HAS/DOES NOT HAVE:19233} concerns regarding medicines.  The following changes have been made:  {PLAN; NO CHANGE:13088:s}  Labs/ tests ordered today include: *** No orders of the defined types were placed in this encounter.    Disposition:   FU with ***    Signed, Minus Breeding, MD  10/21/2018 7:50 PM     [facility]

## 2018-10-22 ENCOUNTER — Ambulatory Visit: Payer: Medicaid Other | Admitting: Cardiology

## 2018-10-23 ENCOUNTER — Other Ambulatory Visit: Payer: Self-pay

## 2018-10-24 ENCOUNTER — Ambulatory Visit: Payer: Medicaid Other | Admitting: Family

## 2018-12-09 DIAGNOSIS — R5383 Other fatigue: Secondary | ICD-10-CM | POA: Insufficient documentation

## 2018-12-09 NOTE — Progress Notes (Deleted)
Cardiology Office Note   Date:  12/09/2018   ID:  Walter [name], DOB 02-Mar-1965, MRN [id number]  PCP:  Sharion Balloon, FNP  Cardiologist:   No primary care provider on file. Referring:  Sharion Balloon, FNP  No chief complaint on file.     History of Present Illness: Walter [name] is a 53 y.o. male who was referred by *** for evaluation of ***   Past Medical History:  Diagnosis Date  . AC (acromioclavicular) joint bone spurs   . Arthritis   . Attention deficit disorder   . Bipolar 1 disorder ([place])   . Chronic pain   . Degenerative disc disease, lumbar   . Family history of adverse reaction to anesthesia    Pt son is combative " it runs in the family"  . History of drug dependence ([place])    opiates  . Hypertension   . Spinal stenosis   . Umbilical hernia     Past Surgical History:  Procedure Laterality Date  . CYSTECTOMY    . INSERTION OF MESH N/A 08/06/2016   Procedure: INSERTION OF MESH;  Surgeon: Coralie Keens, MD;  Location: [facility];  Service: General;  Laterality: N/A;  . MULTIPLE TOOTH EXTRACTIONS    . UMBILICAL HERNIA REPAIR N/A 08/06/2016   Procedure: REPAIR UMBILICAL HERNIA WITH MESH;  Surgeon: Coralie Keens, MD;  Location: [facility];  Service: General;  Laterality: N/A;     Current Outpatient Medications  Medication Sig Dispense Refill  . gabapentin (NEURONTIN) 300 MG capsule Take 1 capsule (300 mg total) by mouth 3 (three) times daily. (Patient not taking: Reported on 09/18/2018) 90 capsule 3  . HYDROcodone-acetaminophen (NORCO) 7.5-325 MG tablet Take 1 tablet by mouth every 12 (twelve) hours as needed for moderate pain. 60 tablet 0  . lisinopril-hydrochlorothiazide (ZESTORETIC) 10-12.5 MG tablet TAKE 1 TABLET BY MOUTH ONCE DAILY. 90 tablet 1  . METHADONE HCL PO Take 120 mg by mouth daily.     . Testosterone Cypionate 200 MG/ML KIT Inject 200 mg into the muscle every 14 (fourteen) days. (Patient not taking: Reported on 09/18/2018) 6 kit 1    No current facility-administered medications for this visit.     Allergies:   No known allergies    Social History:  The patient  reports that he has never smoked. He has never used smokeless tobacco. He reports that he does not drink alcohol or use drugs.   Family History:  The patient's ***family history includes Healthy in his brother, brother, mother, and sister.    ROS:  Please see the history of present illness.   Otherwise, review of systems are positive for {NONE DEFAULTED:18576::"none"}.   All other systems are reviewed and negative.    PHYSICAL EXAM: VS:  There were no vitals taken for this visit. , BMI There is no height or weight on file to calculate BMI. GENERAL:  Well appearing HEENT:  Pupils equal round and reactive, fundi not visualized, oral mucosa unremarkable NECK:  No jugular venous distention, waveform within normal limits, carotid upstroke brisk and symmetric, no bruits, no thyromegaly LYMPHATICS:  No cervical, inguinal adenopathy LUNGS:  Clear to auscultation bilaterally BACK:  No CVA tenderness CHEST:  Unremarkable HEART:  PMI not displaced or sustained,S1 and S2 within normal limits, no S3, no S4, no clicks, no rubs, *** murmurs ABD:  Flat, positive bowel sounds normal in frequency in pitch, no bruits, no rebound, no guarding, no midline pulsatile mass, no hepatomegaly, no  splenomegaly EXT:  2 plus pulses throughout, no edema, no cyanosis no clubbing SKIN:  No rashes no nodules NEURO:  Cranial nerves II through XII grossly intact, motor grossly intact throughout PSYCH:  Cognitively intact, oriented to person place and time    EKG:  EKG {ACTION; IS/IS SPJ:24199144} ordered today. The ekg ordered today demonstrates ***   Recent Labs: No results found for requested labs within last 8760 hours.    Lipid Panel    Component Value Date/Time   CHOL 154 01/09/2016 1010   TRIG 134 01/09/2016 1010   HDL 33 (L) 01/09/2016 1010   CHOLHDL 4.7 01/09/2016 1010    LDLCALC 94 01/09/2016 1010      Wt Readings from Last 3 Encounters:  09/18/18 (!) 406 lb 9.6 oz (184.4 kg)  08/29/17 (!) 408 lb (185.1 kg)  09/06/16 (!) 411 lb 12.8 oz (186.8 kg)      Other studies Reviewed: Additional studies/ records that were reviewed today include: ***. Review of the above records demonstrates:  Please see elsewhere in the note.  ***   ASSESSMENT AND PLAN:  SOB:  ***  FATIGUE:  ***   HTN:  ***  MORBID OBESITY:  **   Current medicines are reviewed at length with the patient today.  The patient {ACTIONS; HAS/DOES NOT HAVE:19233} concerns regarding medicines.  The following changes have been made:  {PLAN; NO CHANGE:13088:s}  Labs/ tests ordered today include: *** No orders of the defined types were placed in this encounter.    Disposition:   FU with ***    Signed, Minus Breeding, MD  12/09/2018 7:37 PM    [facility]

## 2018-12-10 ENCOUNTER — Telehealth: Payer: Self-pay

## 2018-12-10 ENCOUNTER — Ambulatory Visit: Payer: Medicaid Other | Admitting: Cardiology

## 2018-12-10 NOTE — Telephone Encounter (Signed)
LM2CB about missed appt this morning per Dr Percival Spanish.

## 2019-06-01 ENCOUNTER — Ambulatory Visit: Payer: Medicaid Other | Admitting: Family

## 2019-06-04 ENCOUNTER — Encounter: Payer: Self-pay | Admitting: Family

## 2019-06-04 ENCOUNTER — Ambulatory Visit (INDEPENDENT_AMBULATORY_CARE_PROVIDER_SITE_OTHER): Payer: Medicaid Other | Admitting: Family

## 2019-06-04 DIAGNOSIS — R350 Frequency of micturition: Secondary | ICD-10-CM | POA: Diagnosis not present

## 2019-06-04 DIAGNOSIS — R32 Unspecified urinary incontinence: Secondary | ICD-10-CM

## 2019-06-04 DIAGNOSIS — R3915 Urgency of urination: Secondary | ICD-10-CM

## 2019-06-04 NOTE — Progress Notes (Signed)
Virtual Visit via telephone Note Due to COVID-19 pandemic this visit was conducted virtually. This visit type was conducted due to national recommendations for restrictions regarding the COVID-19 Pandemic (e.g. social distancing, sheltering in place) in an effort to limit this patient's exposure and mitigate transmission in our community. All issues noted in this document were discussed and addressed.  A physical exam was not performed with this format.  I connected with Walter [name] on 06/04/19 at 10:45 Am  by telephone and verified that I am speaking with the correct person using two identifiers. Walter [name] is currently located at home  and no one  is currently with him  during visit. The provider, Evelina Dun, FNP is located in their office at time of visit.  I discussed the limitations, risks, security and privacy concerns of performing an evaluation and management service by telephone and the availability of in person appointments. I also discussed with the patient that there may be a patient responsible charge related to this service. The patient expressed understanding and agreed to proceed.   History and Present Illness:  Urinary Frequency  This is a chronic problem. The current episode started more than 1 year ago. The problem occurs intermittently. The problem has been waxing and waning. The pain is at a severity of 0/10. The patient is experiencing no pain. Associated symptoms include frequency, hesitancy, urgency and vomiting. Pertinent negatives include no chills, discharge, flank pain, hematuria or nausea. He has tried increased fluids for the symptoms. The treatment provided mild relief.      Review of Systems  Constitutional: Negative for chills.  Gastrointestinal: Positive for vomiting. Negative for nausea.  Genitourinary: Positive for frequency, hesitancy and urgency. Negative for flank pain and hematuria.  All other systems reviewed and are  negative.    Observations/Objective: No SOB or distress noted   Assessment and Plan: TASHI BAND comes in today with chief complaint of No chief complaint on file.   Diagnosis and orders addressed:  1. Urinary frequency - BMP8+EGFR; Future - CBC with Differential/Platelet; Future - Urinalysis, Complete; Future - PSA, total and free; Future  2. Urinary urgency - BMP8+EGFR; Future - CBC with Differential/Platelet; Future - Urinalysis, Complete; Future - PSA, total and free; Future  3. Urinary incontinence, unspecified type - BMP8+EGFR; Future - CBC with Differential/Platelet; Future - Urinalysis, Complete; Future - PSA, total and free; Future   Labs pending, if all normal may need to start on myrbetriq or oxybutynin. May also need referral to Urologists.  Health Maintenance reviewed Diet and exercise encouraged        I discussed the assessment and treatment plan with the patient. The patient was provided an opportunity to ask questions and all were answered. The patient agreed with the plan and demonstrated an understanding of the instructions.   The patient was advised to call back or seek an in-person evaluation if the symptoms worsen or if the condition fails to improve as anticipated.  The above assessment and management plan was discussed with the patient. The patient verbalized understanding of and has agreed to the management plan. Patient is aware to call the clinic if symptoms persist or worsen. Patient is aware when to return to the clinic for a follow-up visit. Patient educated on when it is appropriate to go to the emergency department.   Time call ended:  11:02 AM  I provided 17 minutes of non-face-to-face time during this encounter.    Evelina Dun, FNP

## 2019-08-17 ENCOUNTER — Other Ambulatory Visit: Payer: Self-pay | Admitting: Family

## 2019-08-17 DIAGNOSIS — I10 Essential (primary) hypertension: Secondary | ICD-10-CM

## 2019-09-13 IMAGING — DX DG LUMBAR SPINE 2-3V
3 series · 3 of 3 positions shown · non-contrast
Comparison: none

[l-spine ap]
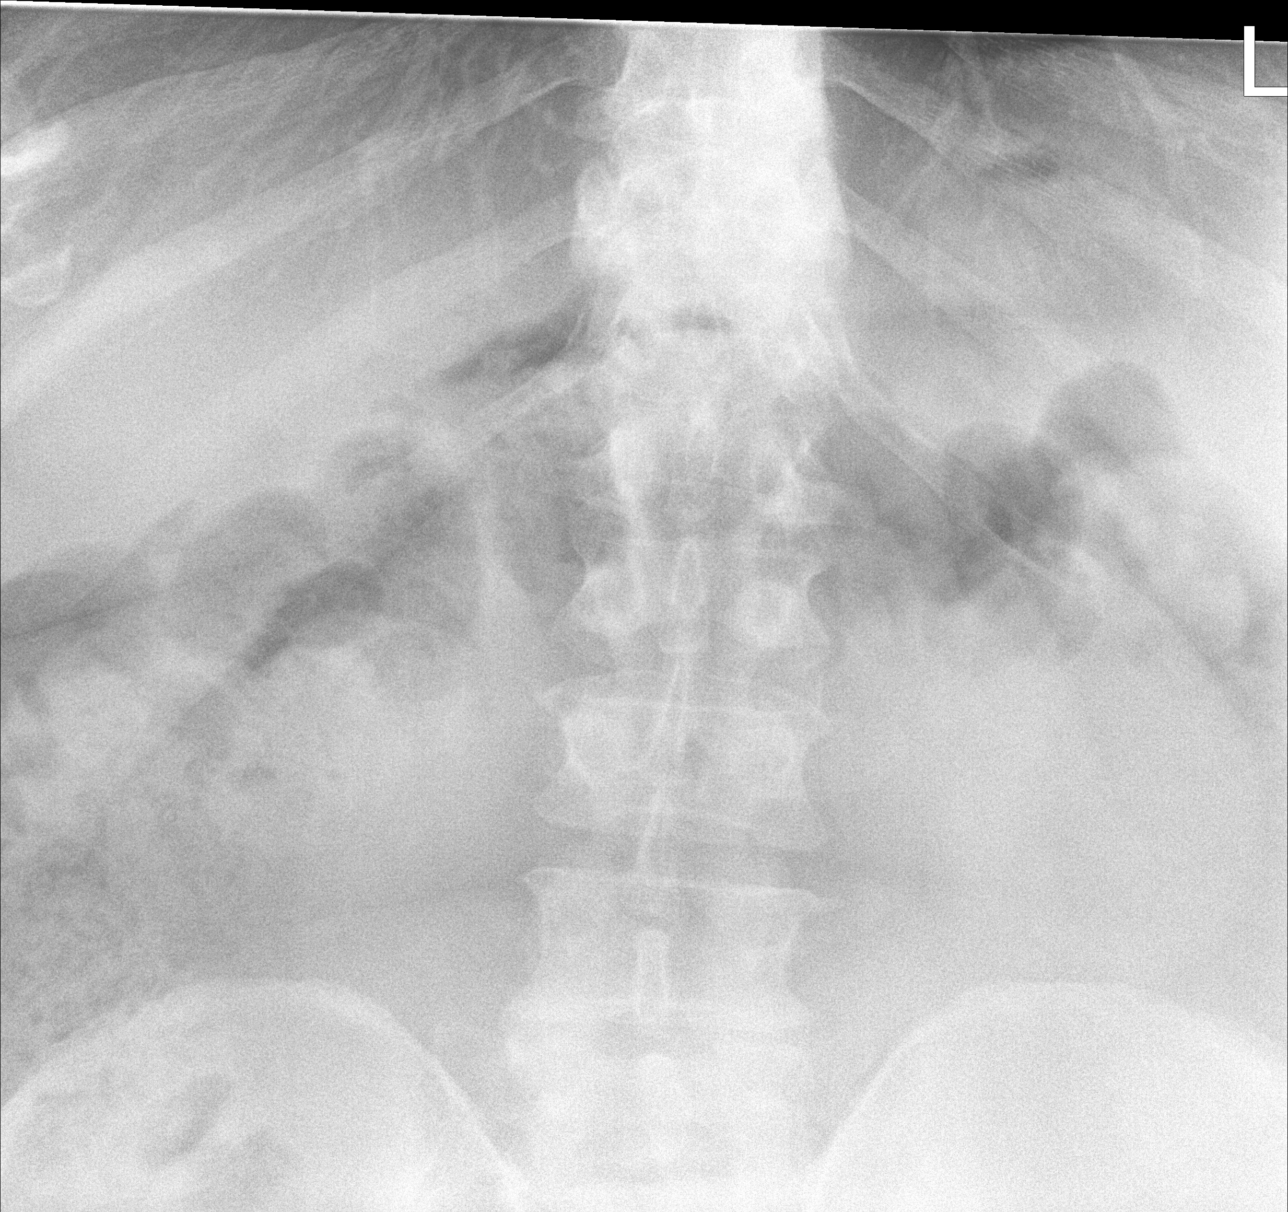

[l-spine lat (1 of 2)]
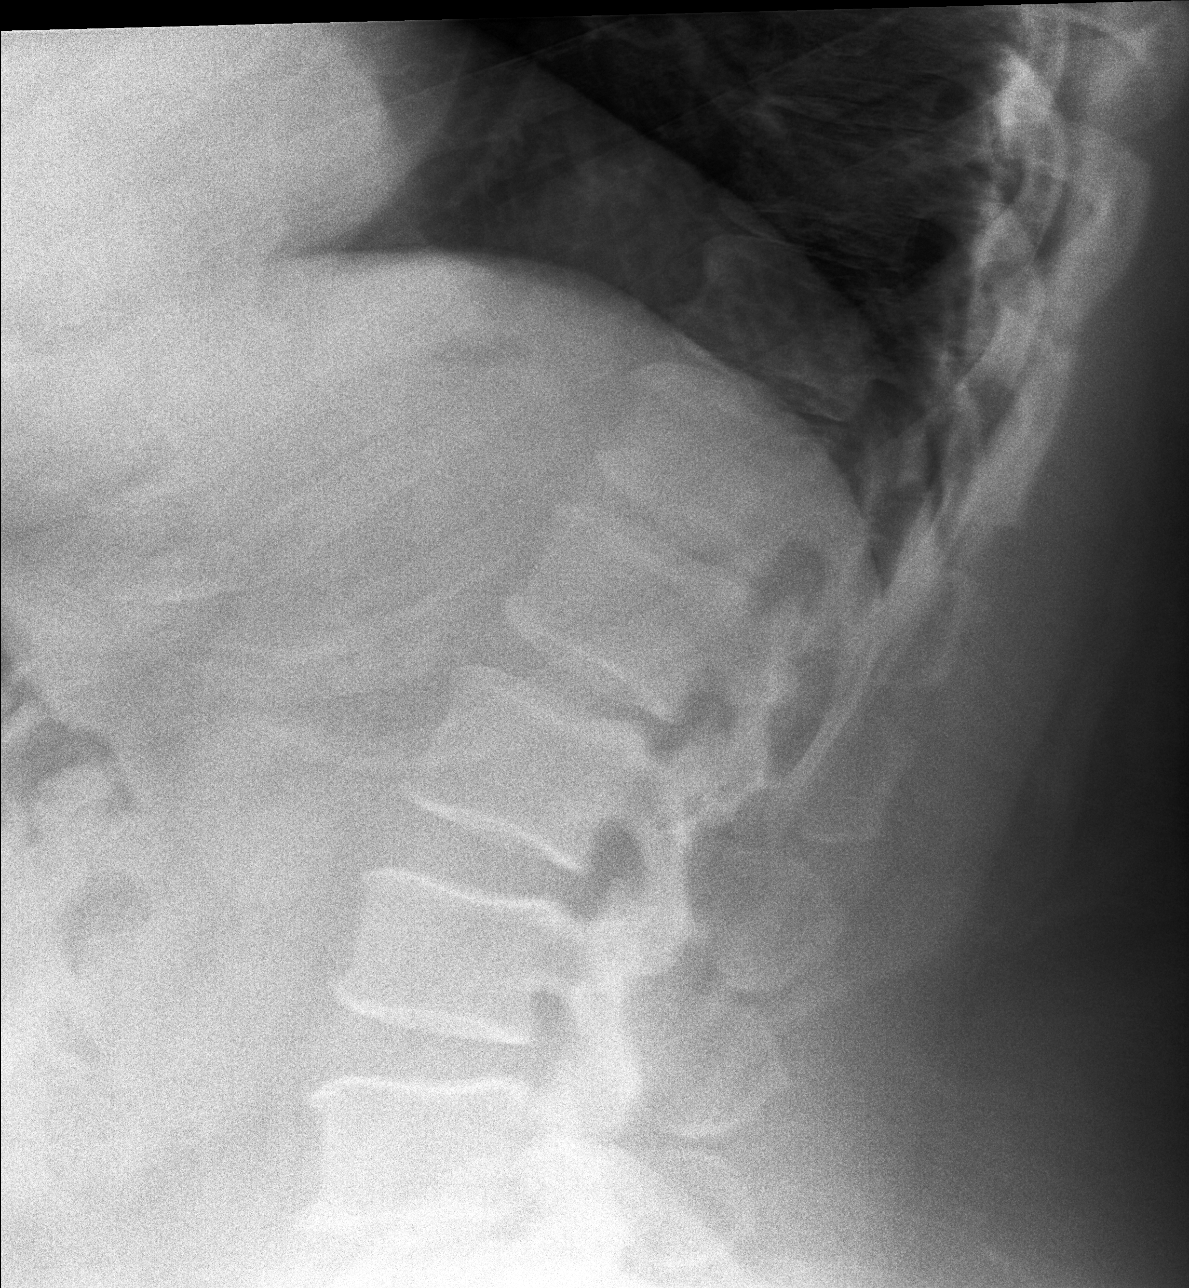

[l-spine lat (2 of 2)]
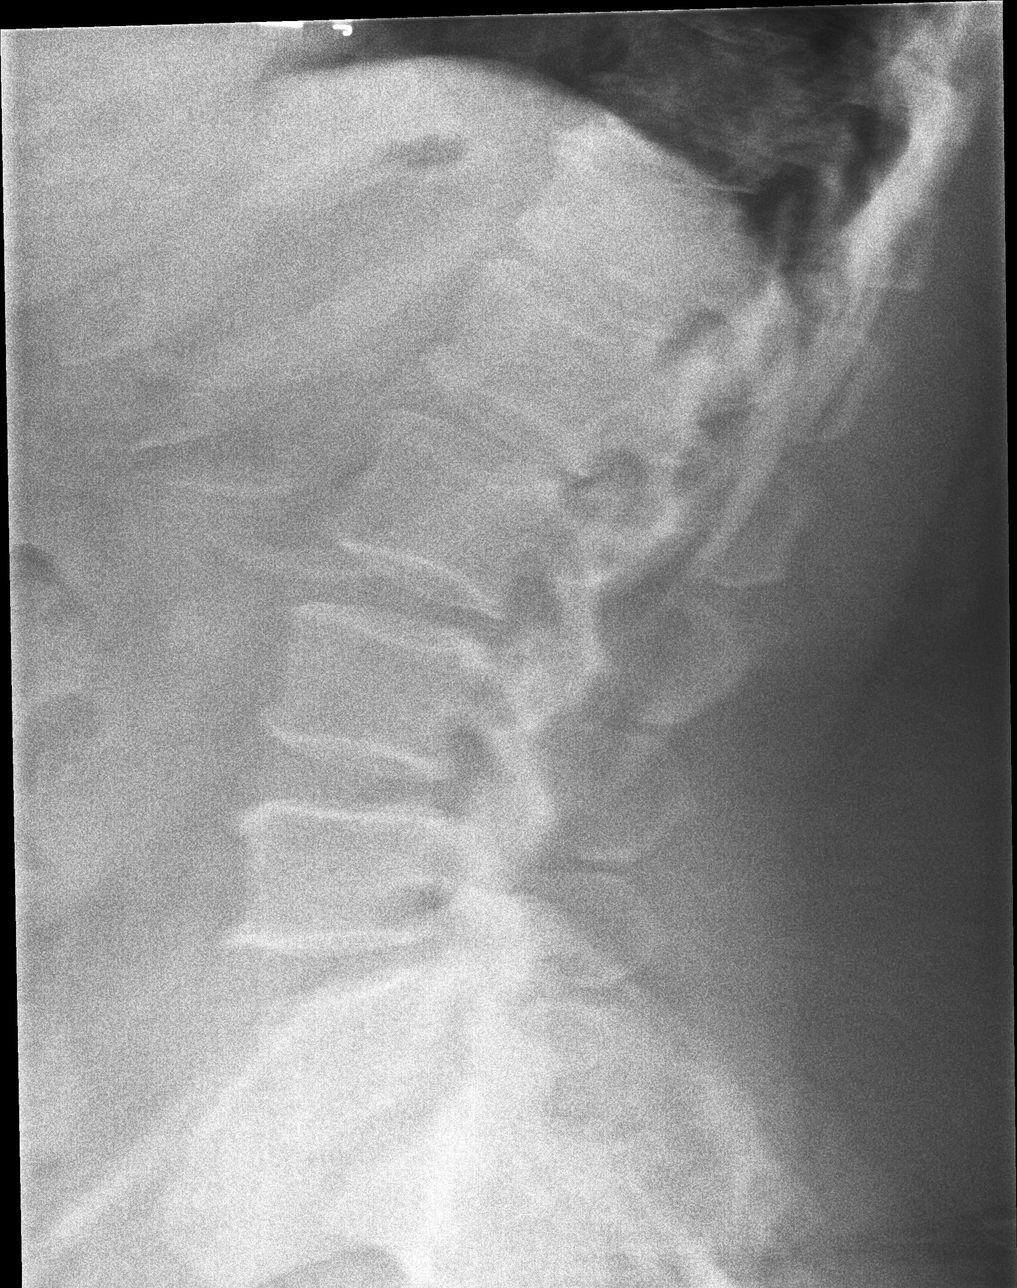

[3 of 3 positions shown; findings below may reference images not displayed]

Canned report from images found in remote index.

Refer to host system for actual result text.

## 2019-10-14 ENCOUNTER — Other Ambulatory Visit: Payer: Self-pay

## 2019-10-14 ENCOUNTER — Encounter: Payer: Self-pay | Admitting: Family

## 2019-10-14 ENCOUNTER — Ambulatory Visit: Payer: Medicaid Other | Admitting: Family

## 2019-10-14 VITALS — BP 170/86 | HR 65 | Temp 97.0°F | Ht 70.0 in | Wt >= 6400 oz

## 2019-10-14 DIAGNOSIS — M5441 Lumbago with sciatica, right side: Secondary | ICD-10-CM

## 2019-10-14 DIAGNOSIS — F411 Generalized anxiety disorder: Secondary | ICD-10-CM

## 2019-10-14 DIAGNOSIS — R4189 Other symptoms and signs involving cognitive functions and awareness: Secondary | ICD-10-CM

## 2019-10-14 DIAGNOSIS — R5383 Other fatigue: Secondary | ICD-10-CM | POA: Diagnosis not present

## 2019-10-14 DIAGNOSIS — I10 Essential (primary) hypertension: Secondary | ICD-10-CM | POA: Diagnosis not present

## 2019-10-14 DIAGNOSIS — S30860A Insect bite (nonvenomous) of lower back and pelvis, initial encounter: Secondary | ICD-10-CM

## 2019-10-14 DIAGNOSIS — F488 Other specified nonpsychotic mental disorders: Secondary | ICD-10-CM

## 2019-10-14 DIAGNOSIS — R400 Somnolence: Secondary | ICD-10-CM

## 2019-10-14 DIAGNOSIS — R0602 Shortness of breath: Secondary | ICD-10-CM

## 2019-10-14 DIAGNOSIS — M2550 Pain in unspecified joint: Secondary | ICD-10-CM | POA: Diagnosis not present

## 2019-10-14 DIAGNOSIS — W57XXXA Bitten or stung by nonvenomous insect and other nonvenomous arthropods, initial encounter: Secondary | ICD-10-CM

## 2019-10-14 DIAGNOSIS — M5442 Lumbago with sciatica, left side: Secondary | ICD-10-CM

## 2019-10-14 DIAGNOSIS — Z6841 Body Mass Index (BMI) 40.0 and over, adult: Secondary | ICD-10-CM

## 2019-10-14 DIAGNOSIS — G8929 Other chronic pain: Secondary | ICD-10-CM

## 2019-10-14 DIAGNOSIS — R0683 Snoring: Secondary | ICD-10-CM

## 2019-10-14 MED ORDER — DULOXETINE HCL 30 MG PO CPEP: ORAL_CAPSULE | ORAL | 0 refills | Status: DC

## 2019-10-14 MED ORDER — LISINOPRIL-HYDROCHLOROTHIAZIDE 10-12.5 MG PO TABS: ORAL_TABLET | ORAL | 2 refills | Status: DC

## 2019-10-14 MED ORDER — DULOXETINE HCL 60 MG PO CPEP: 60.0000 mg | ORAL_CAPSULE | Freq: Every day | ORAL | 3 refills | Status: DC

## 2019-10-14 NOTE — Progress Notes (Signed)
Subjective:    Patient ID: Walter [name], male    DOB: 02/14/1965, 54 y.o.   MRN: [id number]  Chief Complaint  Patient presents with  . Fatigue    BRAIN FOG X 1 YEAR WANTS SOME TEST,  . Headache  . Joint Pain  . Shortness of Breath   PT presents to the office today with complaints of fatigue, brain fog, memory issues, generalized joint pain. He states he snores at night and stops breathing multiple times a night. We ordered a sleep study, but states he was unable to make the appointment because of COVID.   His BP is elevated today because he has not had his medication in a week.   He is requesting to be checked for lyme and RMSF  Because he has had several ticks this year. He reports he removed a tick on his lower back about a month ago. Denies any rash.  Headache  His past medical history is significant for hypertension.  Shortness of Breath Associated symptoms include headaches.  Hypertension This is a chronic problem. The problem is controlled. Associated symptoms include headaches, malaise/fatigue and shortness of breath. Pertinent negatives include no peripheral edema. Risk factors for coronary artery disease include obesity and male gender. Past treatments include ACE inhibitors (has not been on his medication in last week). The current treatment provides mild improvement.      Review of Systems  Constitutional: Positive for malaise/fatigue.  Respiratory: Positive for shortness of breath.   Neurological: Positive for headaches.  All other systems reviewed and are negative.      Objective:   Physical Exam Vitals reviewed.  Constitutional:      General: He is not in acute distress.    Appearance: He is well-developed. He is obese.  HENT:     Head: Normocephalic.     Right Ear: External ear normal.     Left Ear: External ear normal.  Eyes:     General:        Right eye: No discharge.        Left eye: No discharge.     Pupils: Pupils are equal, round, and  reactive to light.  Neck:     Thyroid: No thyromegaly.  Cardiovascular:     Rate and Rhythm: Normal rate and regular rhythm.     Heart sounds: Normal heart sounds. No murmur heard.   Pulmonary:     Effort: Pulmonary effort is normal. No respiratory distress.     Breath sounds: Normal breath sounds. No wheezing.  Abdominal:     General: Bowel sounds are normal. There is no distension.     Palpations: Abdomen is soft.     Tenderness: There is no abdominal tenderness.  Musculoskeletal:        General: No tenderness. Normal range of motion.     Cervical back: Normal range of motion and neck supple.  Skin:    General: Skin is warm and dry.     Findings: No erythema or rash.  Neurological:     Mental Status: He is alert and oriented to person, place, and time.     Cranial Nerves: No cranial nerve deficit.     Deep Tendon Reflexes: Reflexes are normal and symmetric.  Psychiatric:        Behavior: Behavior normal.        Thought Content: Thought content normal.        Judgment: Judgment normal.       BP (!) 170/86  Pulse 65   Temp (!) 97 F (36.1 C) (Temporal)   Ht $R'5\' 10"'oL$  (1.778 m)   Wt (!) 400 lb 3.2 oz (181.5 kg)   SpO2 90%   BMI 57.42 kg/m      Assessment & Plan:  Walter [name] comes in today with chief complaint of Fatigue (BRAIN FOG X 1 YEAR WANTS SOME TEST,), Headache, Joint Pain, and Shortness of Breath   Diagnosis and orders addressed:  1. Essential hypertension Will hold off on changes at this time, since he has not had his medications in a week -Daily blood pressure log given with instructions on how to fill out and told to bring to next visit -Dash diet information given -Exercise encouraged - Stress Management  -Continue current meds -RTO in 4-6 weeks - lisinopril-hydrochlorothiazide (ZESTORETIC) 10-12.5 MG tablet; TAKE 1 TABLET BY MOUTH ONCE DAILY.  Needs to be seen before next refill  Dispense: 90 tablet; Refill: 2 - CMP14+EGFR  2. Brain fog -  CMP14+EGFR - Ambulatory referral to Pulmonology  3. Other fatigue - Arthritis Panel - CMP14+EGFR - Lyme Ab/Western Blot Reflex - Rocky mtn spotted fvr abs pnl(IgG+IgM) - TSH - Ambulatory referral to Pulmonology  4. Generalized joint pain - Arthritis Panel - CMP14+EGFR - Lyme Ab/Western Blot Reflex - Rocky mtn spotted fvr abs pnl(IgG+IgM) - TSH  5. Chronic bilateral low back pain with bilateral sciatica - Arthritis Panel - CMP14+EGFR - Lyme Ab/Western Blot Reflex - Rocky mtn spotted fvr abs pnl(IgG+IgM) - TSH  6. Morbid obesity with BMI of 50.0-59.9, adult ([place]) - CMP14+EGFR  7. Tick bite of back, initial encounter - CMP14+EGFR  8. GAD (generalized anxiety disorder) - CMP14+EGFR - Ambulatory referral to Pulmonology - DULoxetine (CYMBALTA) 30 MG capsule; Take 1 capsule (30 mg total) by mouth daily for 14 days, THEN 2 capsules (60 mg total) daily for 14 days.  Dispense: 42 capsule; Refill: 0 - DULoxetine (CYMBALTA) 60 MG capsule; Take 1 capsule (60 mg total) by mouth daily.  Dispense: 90 capsule; Refill: 3  9. SOB (shortness of breath) - Ambulatory referral to Pulmonology  10. Snoring - Ambulatory referral to Pulmonology  11. Daytime sleepiness - Ambulatory referral to Pulmonology   Labs pending Health Maintenance reviewed Diet and exercise encouraged  Follow up plan: 4-6 weeks to recheck GAD and HTN  Evelina Dun, FNP

## 2019-10-14 NOTE — Patient Instructions (Signed)
Sleep Apnea Sleep apnea is a condition in which breathing pauses or becomes shallow during sleep. Episodes of sleep apnea usually last 10 seconds or longer, and they may occur as many as 20 times an hour. Sleep apnea disrupts your sleep and keeps your body from getting the rest that it needs. This condition can increase your risk of certain health problems, including:  Heart attack.  Stroke.  Obesity.  Diabetes.  Heart failure.  Irregular heartbeat. What are the causes? There are three kinds of sleep apnea:  Obstructive sleep apnea. This kind is caused by a blocked or collapsed airway.  Central sleep apnea. This kind happens when the part of the brain that controls breathing does not send the correct signals to the muscles that control breathing.  Mixed sleep apnea. This is a combination of obstructive and central sleep apnea. The most common cause of this condition is a collapsed or blocked airway. An airway can collapse or become blocked if:  Your throat muscles are abnormally relaxed.  Your tongue and tonsils are larger than normal.  You are overweight.  Your airway is smaller than normal. What increases the risk? You are more likely to develop this condition if you:  Are overweight.  Smoke.  Have a smaller than normal airway.  Are elderly.  Are male.  Drink alcohol.  Take sedatives or tranquilizers.  Have a family history of sleep apnea. What are the signs or symptoms? Symptoms of this condition include:  Trouble staying asleep.  Daytime sleepiness and tiredness.  Irritability.  Loud snoring.  Morning headaches.  Trouble concentrating.  Forgetfulness.  Decreased interest in sex.  Unexplained sleepiness.  Mood swings.  Personality changes.  Feelings of depression.  Waking up often during the night to urinate.  Dry mouth.  Sore throat. How is this diagnosed? This condition may be diagnosed with:  A medical history.  A physical  exam.  A series of tests that are done while you are sleeping (sleep study). These tests are usually done in a sleep lab, but they may also be done at home. How is this treated? Treatment for this condition aims to restore normal breathing and to ease symptoms during sleep. It may involve managing health issues that can affect breathing, such as high blood pressure or obesity. Treatment may include:  Sleeping on your side.  Using a decongestant if you have nasal congestion.  Avoiding the use of depressants, including alcohol, sedatives, and narcotics.  Losing weight if you are overweight.  Making changes to your diet.  Quitting smoking.  Using a device to open your airway while you sleep, such as: ? An oral appliance. This is a custom-made mouthpiece that shifts your lower jaw forward. ? A continuous positive airway pressure (CPAP) device. This device blows air through a mask when you breathe out (exhale). ? A nasal expiratory positive airway pressure (EPAP) device. This device has valves that you put into each nostril. ? A bi-level positive airway pressure (BPAP) device. This device blows air through a mask when you breathe in (inhale) and breathe out (exhale).  Having surgery if other treatments do not work. During surgery, excess tissue is removed to create a wider airway. It is important to get treatment for sleep apnea. Without treatment, this condition can lead to:  High blood pressure.  Coronary artery disease.  In men, an inability to achieve or maintain an erection (impotence).  Reduced thinking abilities. Follow these instructions at home: Lifestyle  Make any lifestyle changes   that your health care provider recommends.  Eat a healthy, well-balanced diet.  Take steps to lose weight if you are overweight.  Avoid using depressants, including alcohol, sedatives, and narcotics.  Do not use any products that contain nicotine or tobacco, such as cigarettes,  e-cigarettes, and chewing tobacco. If you need help quitting, ask your health care provider. General instructions  Take over-the-counter and prescription medicines only as told by your health care provider.  If you were given a device to open your airway while you sleep, use it only as told by your health care provider.  If you are having surgery, make sure to tell your health care provider you have sleep apnea. You may need to bring your device with you.  Keep all follow-up visits as told by your health care provider. This is important. Contact a health care provider if:  The device that you received to open your airway during sleep is uncomfortable or does not seem to be working.  Your symptoms do not improve.  Your symptoms get worse. Get help right away if:  You develop: ? Chest pain. ? Shortness of breath. ? Discomfort in your back, arms, or stomach.  You have: ? Trouble speaking. ? Weakness on one side of your body. ? Drooping in your face. These symptoms may represent a serious problem that is an emergency. Do not wait to see if the symptoms will go away. Get medical help right away. Call your local emergency services (911 in the [place] Do not drive yourself to the hospital. Summary  Sleep apnea is a condition in which breathing pauses or becomes shallow during sleep.  The most common cause is a collapsed or blocked airway.  The goal of treatment is to restore normal breathing and to ease symptoms during sleep. This information is not intended to replace advice given to you by your health care provider. Make sure you discuss any questions you have with your health care provider. Document Revised: 06/25/2018 Document Reviewed: 09/03/2017 Elsevier Patient Education  2020 [employer]  

## 2019-10-29 ENCOUNTER — Other Ambulatory Visit: Payer: Self-pay | Admitting: Family

## 2019-11-02 ENCOUNTER — Other Ambulatory Visit: Payer: Self-pay | Admitting: Family

## 2019-12-31 ENCOUNTER — Institutional Professional Consult (permissible substitution): Payer: Medicaid Other | Admitting: Pulmonary Disease

## 2020-02-23 ENCOUNTER — Encounter: Payer: Self-pay | Admitting: Pulmonary Disease

## 2020-02-23 ENCOUNTER — Ambulatory Visit (INDEPENDENT_AMBULATORY_CARE_PROVIDER_SITE_OTHER): Payer: Medicaid Other | Admitting: Pulmonary Disease

## 2020-02-23 ENCOUNTER — Other Ambulatory Visit: Payer: Self-pay

## 2020-02-23 VITALS — BP 168/100 | HR 58 | Temp 97.1°F | Ht 71.0 in | Wt >= 6400 oz

## 2020-02-23 DIAGNOSIS — R0683 Snoring: Secondary | ICD-10-CM

## 2020-02-23 DIAGNOSIS — Z9189 Other specified personal risk factors, not elsewhere classified: Secondary | ICD-10-CM | POA: Diagnosis not present

## 2020-02-23 DIAGNOSIS — F1120 Opioid dependence, uncomplicated: Secondary | ICD-10-CM | POA: Diagnosis not present

## 2020-02-23 NOTE — Patient Instructions (Signed)
Will arrange for in lab sleep study Will call to arrange for follow up after sleep study reviewed  

## 2020-02-23 NOTE — Progress Notes (Signed)
[facility]  Chief Complaint  Patient presents with  . Consult    Sleep consult    Constitutional:  BP (!) 168/100 (BP Location: Left Arm, Cuff Size: Normal)   Pulse (!) 58   Temp (!) 97.1 F (36.2 C) (Other (Comment)) Comment (Src): wrist  Ht $R'5\' 11"'Iv$  (1.803 m)   Wt (!) 420 lb (190.5 kg)   SpO2 96% Comment: Room air  BMI 58.58 kg/m   Past Medical History:  OA, ADD, Bipolar, Chronic pain, DJD, HTN, Spinal stenosis  Past Surgical History:  Walter [name]  has a past surgical history that includes Cystectomy; Multiple tooth extractions; Umbilical hernia repair (N/A, 08/06/2016); and Insertion of mesh (N/A, 08/06/2016).  Brief Summary:  Walter [name] is a 55 y.o. male with snoring.  Walter [name] has history of chronic pain.      Subjective:   Walter [name] is here with his wife.  Walter [name] has noticed trouble with his sleep for a while.  This has been getting worse as Walter [name] has gained weight.  Walter [name] has to sleep in a recliner.  If Walter [name] lays too flat, then Walter [name] feels like his breathing cuts off.  Walter [name] is a restless sleeper and has loud snoring.  Walter [name] has history of substance abuse and chronic pain.  Walter [name] is on methadone maintenance therapy.  Walter [name] goes to sleep at 9 pm.  Walter [name] falls asleep immediately.  Walter [name] wakes up 2 or 3 times to use the bathroom.  Walter [name] gets out of bed at 645 am.  Walter [name] feels more tired in the morning.  Walter [name] denies morning headache.  Walter [name] does not use anything to help him fall sleep or stay awake.  Walter [name] denies sleep walking, sleep talking, bruxism, or nightmares.  There is no history of restless legs.  Walter [name] denies sleep hallucinations, sleep paralysis, or cataplexy.  The Epworth score is 19 out of 24.    Physical Exam:   Appearance - well kempt   ENMT - no sinus tenderness, no oral exudate, no LAN, Mallampati 4 airway, no stridor, high arched palate  Respiratory - equal breath sounds bilaterally, no wheezing or rales  CV - s1s2 regular rate and rhythm, no murmurs  Ext - no  clubbing, no edema  Skin - no rashes  Psych - normal mood and affect   Sleep Tests:    Social History:  Walter [name]  reports that Walter [name] has never smoked. Walter [name] has never used smokeless tobacco. Walter [name] reports current drug use. Drug: Marijuana. Walter [name] reports that Walter [name] does not drink alcohol.  Family History:  His family history includes Healthy in his brother, brother, mother, and sister.    Discussion:  Walter [name] has snoring, sleep disruption, apnea and daytime sleepiness.  Walter [name] has history of hypertension.  His BMI is > 35.  I am concerned Walter [name] could have obstructive sleep apnea.  Walter [name] is morbidly obese and is on chronic opiate therapy.  Walter [name] could also have hypoventilation and/or central apnea.  Assessment/Plan:    Snoring with excessive daytime sleepiness. - will need to arrange for a in lab sleep study to assess for obstructive sleep apnea, central sleep apnea, and sleep related hypoxia/hypoventilation  Obesity. - discussed how weight can impact sleep and risk for sleep disordered breathing - discussed options to assist with weight loss: combination of diet modification, cardiovascular and strength training exercises  Cardiovascular risk. - had an extensive discussion regarding the adverse health consequences related to untreated sleep disordered breathing - specifically discussed the  risks for hypertension, coronary artery disease, cardiac dysrhythmias, cerebrovascular disease, and diabetes - lifestyle modification discussed - advised him to discuss his issues with blood pressure medication side effects with his PCP, and explained the importance of maintaining compliance with his medications  Safe driving practices. - discussed how sleep disruption can increase risk of accidents, particularly when driving - safe driving practices were discussed  Therapies for obstructive sleep apnea. - if the sleep study shows significant sleep apnea, then various therapies for treatment were reviewed: CPAP, oral appliance,  and surgical interventions   Time Spent Involved in Patient Care on Day of Examination:  33 minutes  Follow up:  Patient Instructions  Will arrange for in lab sleep study Will call to arrange for follow up after sleep study reviewed    Medication List:   Allergies as of 02/23/2020      Reactions   No Known Allergies       Medication List       Accurate as of February 23, 2020 11:45 AM. If you have any questions, ask your nurse or doctor.        DULoxetine 30 MG capsule Commonly known as: Cymbalta Take 1 capsule (30 mg total) by mouth daily for 14 days, THEN 2 capsules (60 mg total) daily for 14 days. Start taking on: October 14, 2019   DULoxetine 60 MG capsule Commonly known as: Cymbalta Take 1 capsule (60 mg total) by mouth daily.   lisinopril-hydrochlorothiazide 10-12.5 MG tablet Commonly known as: ZESTORETIC TAKE 1 TABLET BY MOUTH ONCE DAILY.  Needs to be seen before next refill   METHADONE HCL PO Take 120 mg by mouth daily.   Testosterone Cypionate 200 MG/ML Kit Inject 200 mg into the muscle every 14 (fourteen) days.       Signature:  Chesley Mires, MD [place] Pager - [phone] 02/23/2020, 11:45 AM

## 2020-02-24 ENCOUNTER — Telehealth: Payer: Self-pay | Admitting: Family

## 2020-02-24 NOTE — Telephone Encounter (Signed)
Pt reports getting the moderna vaccine (2). He was going for his booster  Last week, but it snowed.  Pt states he will get his booster vaccine this weekend.

## 2020-03-01 ENCOUNTER — Other Ambulatory Visit: Payer: Self-pay

## 2020-03-01 ENCOUNTER — Ambulatory Visit: Payer: Medicaid Other | Attending: Pulmonary Disease | Admitting: Pulmonary Disease

## 2020-03-01 DIAGNOSIS — R0902 Hypoxemia: Secondary | ICD-10-CM | POA: Insufficient documentation

## 2020-03-01 DIAGNOSIS — R0683 Snoring: Secondary | ICD-10-CM

## 2020-03-01 DIAGNOSIS — G4736 Sleep related hypoventilation in conditions classified elsewhere: Secondary | ICD-10-CM | POA: Insufficient documentation

## 2020-03-01 DIAGNOSIS — G4733 Obstructive sleep apnea (adult) (pediatric): Secondary | ICD-10-CM | POA: Diagnosis not present

## 2020-03-01 DIAGNOSIS — Z9189 Other specified personal risk factors, not elsewhere classified: Secondary | ICD-10-CM

## 2020-03-01 DIAGNOSIS — F1120 Opioid dependence, uncomplicated: Secondary | ICD-10-CM

## 2020-03-10 ENCOUNTER — Telehealth: Payer: Self-pay | Admitting: Pulmonary Disease

## 2020-03-10 NOTE — Procedures (Signed)
    Patient Name: Walter [name], Walter [name] Date: 03/01/2020 Gender: Male D.O.B: 26-Dec-1965 Age (years): 55 Referring Provider: Coralyn Helling MD, ABSM Height (inches): 71 Interpreting Physician: Coralyn Helling MD, ABSM Weight (lbs): 415 RPSGT: Alfonso Ellis BMI: 58 MRN: [id number] Neck Size: 22.00  CLINICAL INFORMATION Sleep Study Type: NPSG  Indication for sleep study: snoring, sleep disruption, and daytime sleepiness.  Epworth Sleepiness Score: 18  SLEEP STUDY TECHNIQUE As per the AASM Manual for the Scoring of Sleep and Associated Events v2.3 (April 2016) with a hypopnea requiring 4% desaturations.  The channels recorded and monitored were frontal, central and occipital EEG, electrooculogram (EOG), submentalis EMG (chin), nasal and oral airflow, thoracic and abdominal wall motion, anterior tibialis EMG, snore microphone, electrocardiogram, and pulse oximetry.  MEDICATIONS Medications self-administered by patient taken the night of the study : N/A  SLEEP ARCHITECTURE The study was initiated at 11:02:09 PM and ended at 5:30:40 AM.  Sleep onset time was 11.7 minutes and the sleep efficiency was 74.3%. The total sleep time was 288.8 minutes.  Stage REM latency was 330.0 minutes.  The patient spent 3.12% of the night in stage N1 sleep, 71.95% in stage N2 sleep, 21.99% in stage N3 and 2.9% in REM.  Alpha intrusion was absent.  Supine sleep was 61.95%.  RESPIRATORY PARAMETERS The overall apnea/hypopnea index (AHI) was 77.9 per hour. There were 76 total apneas, including 54 obstructive, 20 central and 2 mixed apneas. There were 299 hypopneas and 0 RERAs.  The AHI during Stage REM sleep was 77.6 per hour.  AHI while supine was 88.5 per hour.  The mean oxygen saturation was 88.15%. The minimum SpO2 during sleep was 80.00%.  loud snoring was noted during this study.  CARDIAC DATA The 2 lead EKG demonstrated sinus rhythm. The mean heart rate was 74.66 beats per minute.  Other EKG findings include: PVCs.  LEG MOVEMENT DATA The total PLMS were 0 with a resulting PLMS index of 0.00. Associated arousal with leg movement index was 0.0 .  IMPRESSIONS - Severe obstructive sleep apnea with an AHI of 77.9 and SpO2 low of 80%.  - He spent 161 minutes of sleep time with SpO2 < 88%.  He did not use supplemental oxygen during this study. - The patient snored with loud snoring volume.  DIAGNOSIS - Obstructive Sleep Apnea (G47.33) - Nocturnal Hypoxemia (G47.36)  RECOMMENDATIONS - Therapeutic CPAP titration to determine optimal pressure required to alleviate sleep disordered breathing. - Avoid alcohol, sedatives and other CNS depressants that may worsen sleep apnea and disrupt normal sleep architecture. - Sleep hygiene should be reviewed to assess factors that may improve sleep quality. - Weight management and regular exercise should be initiated or continued if appropriate.  [Electronically signed] 03/10/2020 02:00 PM  Coralyn Helling MD, ABSM Diplomate, American Board of Sleep Medicine   NPI: [id number]

## 2020-03-10 NOTE — Telephone Encounter (Signed)
PSG 03/01/20 >> AHI 77.9, SpO2 low 80%.  Spent 161 min with SpO2 < 88%.   Please inform him that his sleep study shows severe obstructive sleep apnea.  Please arrange for ROV with me or NP to discuss treatment options.

## 2020-03-11 NOTE — Telephone Encounter (Signed)
Called and went over HST results per Dr Craige Cotta with patient. All questions answered and patient expressed full understanding. Scheduled office visit with NP for Monday 03/14/2020 at 10:30am at the [facility] office. Patient agreeable to time, date and location. Nothing further needed at this time.

## 2020-03-14 ENCOUNTER — Ambulatory Visit: Payer: Medicaid Other | Admitting: Primary Care

## 2020-03-14 NOTE — Progress Notes (Addendum)
$'@Patient'F$  ID: Walter [name], male    DOB: 1965/05/22, 55 y.o.   MRN: [id number]  Chief Complaint  Patient presents with   Follow-up    Sleep study results    Referring provider: Sharion Balloon, FNP  HPI: 55 year old male, never smoked. PMH significant for HTN, snoring, obesity. Patient of Dr. Halford [name], seen for initial sleep consult on 02/23/20. In-lab sleep study 03/01/20 showed severe OSA, AHI 77.9 with SpO2 low 80%.   03/15/2020 Patient presents today to review recent sleep study.  He reports non-restorative sleep and loud snoring. He has split [name] sleep study on 03/01/20 that showed severe obstructive sleep apnea. He unfortunately did not meet qualifications for split [name] protocol d/t events occurring mid-latter part of study.Treatment options include weight loss, side sleeping position, oral appliance, CPAP or referral to ENT. Due to severity of sleep apnea recommend CPAP therapy. He is agreeing to proceed with treatment. He reports occasional nasal congestion and sleeps with his mouth open. Full mask would likely be best option for him but he does have a beard which could effect mask seal. He would like to work on weight loss, hopes that if he gets some rest at [name] he will have more engery to start exercising.    Allergies  Allergen Reactions   No Known Allergies     Immunization History  Administered Date(s) Administered   Influenza-Unspecified 01/30/2017   Moderna Sars-Covid-2 Vaccination 05/22/2019, 06/01/2019    Past Medical History:  Diagnosis Date   AC (acromioclavicular) joint bone spurs    Arthritis    Attention deficit disorder    Bipolar 1 disorder (HCC)    Chronic pain    Degenerative disc disease, lumbar    Family history of adverse reaction to anesthesia    Pt son is combative " it runs in the family"   History of drug dependence ([place])    opiates   Hypertension    Spinal stenosis    Umbilical hernia     Tobacco History: Social History    Tobacco Use  Smoking Status Never Smoker  Smokeless Tobacco Never Used   Counseling given: Not Answered   Outpatient Medications Prior to Visit  Medication Sig Dispense Refill   METHADONE HCL PO Take 120 mg by mouth daily.      DULoxetine (CYMBALTA) 30 MG capsule Take 1 capsule (30 mg total) by mouth daily for 14 days, THEN 2 capsules (60 mg total) daily for 14 days. 42 capsule 0   DULoxetine (CYMBALTA) 60 MG capsule Take 1 capsule (60 mg total) by mouth daily. (Patient not taking: No sig reported) 90 capsule 3   lisinopril-hydrochlorothiazide (ZESTORETIC) 10-12.5 MG tablet TAKE 1 TABLET BY MOUTH ONCE DAILY.  Needs to be seen before next refill (Patient not taking: No sig reported) 90 tablet 2   Testosterone Cypionate 200 MG/ML KIT Inject 200 mg into the muscle every 14 (fourteen) days. (Patient not taking: No sig reported) 6 kit 1   No facility-administered medications prior to visit.   Review of Systems  Review of Systems  Constitutional: Positive for fatigue.  HENT:       Nasal congestion   Respiratory: Negative.   Psychiatric/Behavioral: Positive for sleep disturbance.   Physical Exam  BP 138/86 (BP Location: Right Arm, Cuff Size: Large)    Pulse 65    Temp 98.7 F (37.1 C)    Ht 6' (1.829 m)    Wt (!) 420 lb (190.5 kg)  SpO2 96%    BMI 56.96 kg/m  Physical Exam Constitutional:      Appearance: Normal appearance.  HENT:     Head: Normocephalic and atraumatic.     Mouth/Throat:     Comments: Deferred d/t mask Cardiovascular:     Rate and Rhythm: Normal rate.  Pulmonary:     Effort: Pulmonary effort is normal.  Musculoskeletal:        General: Normal range of motion.  Skin:    General: Skin is warm and dry.  Neurological:     General: No focal deficit present.     Mental Status: He is alert and oriented to person, place, and time. Mental status is at baseline.  Psychiatric:        Mood and Affect: Mood normal.        Behavior: Behavior normal.         Thought Content: Thought content normal.        Judgment: Judgment normal.      Lab Results:  CBC    Component Value Date/Time   WBC 7.3 09/06/2016 1041   WBC 7.0 08/06/2016 0703   RBC 5.09 09/06/2016 1041   RBC 4.96 08/06/2016 0703   HGB 14.2 09/06/2016 1041   HCT 43.5 09/06/2016 1041   PLT 270 09/06/2016 1041   MCV 86 09/06/2016 1041   MCH 27.9 09/06/2016 1041   MCH 27.0 08/06/2016 0703   MCHC 32.6 09/06/2016 1041   MCHC 31.1 08/06/2016 0703   RDW 15.3 09/06/2016 1041   LYMPHSABS 1.9 09/06/2016 1041   EOSABS 0.3 09/06/2016 1041   BASOSABS 0.0 09/06/2016 1041    BMET    Component Value Date/Time   NA 140 08/29/2017 1230   K 3.9 08/29/2017 1230   CL 98 08/29/2017 1230   CO2 30 (H) 08/29/2017 1230   GLUCOSE 107 (H) 08/29/2017 1230   GLUCOSE 108 (H) 08/06/2016 0703   BUN 10 08/29/2017 1230   CREATININE 0.76 08/29/2017 1230   CALCIUM 8.9 08/29/2017 1230   GFRNONAA 106 08/29/2017 1230   GFRAA 123 08/29/2017 1230    BNP No results found for: BNP  ProBNP No results found for: PROBNP  Imaging: No results found.   Assessment & Plan:   OSA (obstructive sleep apnea) - PSG 03/01/20 showed severe OSA, AHI 77.9 with SpO2 low 80%. He did not meet requirement for split [name] protocol.  - Treatment options include weight loss, side sleeping position, oral appliance, CPAP or referral to ENT. Due to severity of sleep apnea recommend CPAP therapy. He is agreeing to proceed with treatment. - Order new CPAP start; Auto titrate 5-20cm h20 (priority). If events are not eliminated on auto PAP would recommend titration study.  - Recommend he wear CPAP every [name] for minimum 4-6 hours or longer - Encouraged he continue to work on weight loss efforts - FU 31-90 days after CPAP start    Walter Ehrich, NP 03/15/2020

## 2020-03-15 ENCOUNTER — Other Ambulatory Visit: Payer: Self-pay

## 2020-03-15 ENCOUNTER — Encounter: Payer: Self-pay | Admitting: Primary Care

## 2020-03-15 ENCOUNTER — Ambulatory Visit (INDEPENDENT_AMBULATORY_CARE_PROVIDER_SITE_OTHER): Payer: Medicaid Other | Admitting: Primary Care

## 2020-03-15 VITALS — BP 138/86 | HR 65 | Temp 98.7°F | Ht 72.0 in | Wt >= 6400 oz

## 2020-03-15 DIAGNOSIS — G4733 Obstructive sleep apnea (adult) (pediatric): Secondary | ICD-10-CM

## 2020-03-15 NOTE — Patient Instructions (Signed)
Sleep study showed severe obstructive sleep apnea, you have on average 77 events an hours. Lowest your oxygen went was 80%, baseline it was 93%. Recommend starting CPAP therapy.   Recommendations: Start CPAP therapy Aim to wear every night for 4-6 hours or more Do not drive if experiencing excessive daytime fatigue or somnolence Continue to work on weight loss efforts   Orders: New CPAP start 5-20cm h20 (priority)  Follow-up: 31-90 days after receiving CPAP machine     CPAP and BPAP Information CPAP and BPAP are methods that use air pressure to keep your airways open and to help you breathe well. CPAP and BPAP use different amounts of pressure. Your health care provider will tell you whether CPAP or BPAP would be more helpful for you.  CPAP stands for "continuous positive airway pressure." With CPAP, the amount of pressure stays the same while you breathe in and out.  BPAP stands for "bi-level positive airway pressure." With BPAP, the amount of pressure will be higher when you breathe in (inhale) and lower when you breathe out(exhale). This allows you to take larger breaths. CPAP or BPAP may be used in the hospital, or your health care provider may want you to use it at home. You may need to have a sleep study before your health care provider can order a machine for you to use at home. Why are CPAP and BPAP treatments used? CPAP or BPAP can be helpful if you have:  Sleep apnea.  Chronic obstructive pulmonary disease (COPD).  Heart failure.  Medical conditions that cause muscle weakness, including muscular dystrophy or amyotrophic lateral sclerosis (ALS).  Other problems that cause breathing to be shallow, weak, abnormal, or difficult. CPAP and BPAP are most commonly used for obstructive sleep apnea (OSA) to keep the airways from collapsing when the muscles relax during sleep. How is CPAP or BPAP administered? Both CPAP and BPAP are provided by a small machine with a flexible  plastic tube that attaches to a plastic mask that you wear. Air is blown through the mask into your nose or mouth. The amount of pressure that is used to blow the air can be adjusted on the machine. Your health care provider will set the pressure setting and help you find the best mask for you. When should CPAP or BPAP be used? In most cases, the mask only needs to be worn during sleep. Generally, the mask needs to be worn throughout the night and during any daytime naps. People with certain medical conditions may also need to wear the mask at other times when they are awake. Follow instructions from your health care provider about when to use the machine. What are some tips for using the mask?  Because the mask needs to be snug, some people feel trapped or closed-in (claustrophobic) when first using the mask. If you feel this way, you may need to get used to the mask. One way to do this is to hold the mask loosely over your nose or mouth and then gradually apply the mask more snugly. You can also gradually increase the amount of time that you use the mask.  Masks are available in various types and sizes. If your mask does not fit well, talk with your health care provider about getting a different one. Some common types of masks include: ? Full face masks, which fit over the mouth and nose. ? Nasal masks, which fit over the nose. ? Nasal pillow or prong masks, which fit into the  nostrils.  If you are using a mask that fits over your nose and you tend to breathe through your mouth, a chin strap may be applied to help keep your mouth closed.  Some CPAP and BPAP machines have alarms that may sound if the mask comes off or develops a leak.  If you have trouble with the mask, it is very important that you talk with your health care provider about finding a way to make the mask easier to tolerate. Do not stop using the mask. There could be a negative impact to your health if you stop using the mask.    What are some tips for using the machine?  Place your CPAP or BPAP machine on a secure table or stand near an electrical outlet.  Know where the on/off switch is on the machine.  Follow instructions from your health care provider about how to set the pressure on your machine and when you should use it.  Do not eat or drink while the CPAP or BPAP machine is on. Food or fluids could get pushed into your lungs by the pressure of the CPAP or BPAP.  For home use, CPAP and BPAP machines can be rented or purchased through home health care companies. Many different brands of machines are available. Renting a machine before purchasing may help you find out which particular machine works well for you. Your insurance may also decide which machine you may get.  Keep the CPAP or BPAP machine and attachments clean. Ask your health care provider for specific instructions. Follow these instructions at home:  Do not use any products that contain nicotine or tobacco, such as cigarettes, e-cigarettes, and chewing tobacco. If you need help quitting, ask your health care provider.  Keep all follow-up visits as told by your health care provider. This is important. Contact a health care provider if:  You have redness or pressure sores on your head, face, mouth, or nose from the mask or head gear.  You have trouble using the CPAP or BPAP machine.  You cannot tolerate wearing the CPAP or BPAP mask.  Someone tells you that you snore even when wearing your CPAP or BPAP. Get help right away if:  You have trouble breathing.  You feel confused. Summary  CPAP and BPAP are methods that use air pressure to keep your airways open and to help you breathe well.  You may need to have a sleep study before your health care provider can order a machine for home use.  If you have trouble with the mask, it is very important that you talk with your health care provider about finding a way to make the mask easier to  tolerate. Do not stop using the mask. There could be a negative impact to your health if you stop using the mask.  Follow instructions from your health care provider about when to use the machine. This information is not intended to replace advice given to you by your health care provider. Make sure you discuss any questions you have with your health care provider. Document Revised: 01/30/2019 Document Reviewed: 02/02/2019 Elsevier Patient Education  2021 [employer].

## 2020-03-15 NOTE — Assessment & Plan Note (Addendum)
-   PSG 03/01/20 showed severe OSA, AHI 77.9 with SpO2 low 80%. He did not meet requirement for split night protocol.  - Treatment options include weight loss, side sleeping position, oral appliance, CPAP or referral to ENT. Due to severity of sleep apnea recommend CPAP therapy. He is agreeing to proceed with treatment. - Order new CPAP start; Auto titrate 5-20cm h20 (priority). If events are not eliminated on auto PAP would recommend titration study.  - Recommend he wear CPAP every night for minimum 4-6 hours or longer - Encouraged he continue to work on weight loss efforts - FU 31-90 days after CPAP start

## 2020-03-15 NOTE — Progress Notes (Signed)
Reviewed and agree with assessment/plan.   Jearline Hirschhorn, MD [place] [facility] 03/15/2020, 11:33 AM Pager:  [phone]  

## 2020-03-16 ENCOUNTER — Telehealth: Payer: Self-pay | Admitting: Primary Care

## 2020-03-16 NOTE — Telephone Encounter (Signed)
LM on secure voice mail making Vikki Ports aware sleep study has been faxed.   [facility] to provide Cpap machine.   Nothing further need at this time.

## 2020-03-22 ENCOUNTER — Ambulatory Visit: Payer: Medicaid Other | Admitting: Family

## 2020-04-08 ENCOUNTER — Ambulatory Visit: Payer: Medicaid Other | Admitting: Family

## 2020-04-18 ENCOUNTER — Ambulatory Visit: Payer: Medicaid Other | Admitting: Family

## 2020-04-28 ENCOUNTER — Ambulatory Visit: Payer: Medicaid Other | Admitting: Family

## 2020-05-07 ENCOUNTER — Emergency Department (HOSPITAL_COMMUNITY)
Admission: EM | Admit: 2020-05-07 | Discharge: 2020-05-07 | Disposition: A | Discharge status: Home / Self Care | Payer: Medicaid Other | Attending: Emergency Medicine | Admitting: Emergency Medicine

## 2020-05-07 ENCOUNTER — Encounter (HOSPITAL_COMMUNITY): Payer: Self-pay | Admitting: Emergency Medicine

## 2020-05-07 ENCOUNTER — Other Ambulatory Visit: Payer: Self-pay

## 2020-05-07 ENCOUNTER — Emergency Department (HOSPITAL_COMMUNITY): Payer: Medicaid Other

## 2020-05-07 DIAGNOSIS — M25461 Effusion, right knee: Secondary | ICD-10-CM | POA: Diagnosis not present

## 2020-05-07 DIAGNOSIS — M25561 Pain in right knee: Secondary | ICD-10-CM | POA: Diagnosis present

## 2020-05-07 DIAGNOSIS — I10 Essential (primary) hypertension: Secondary | ICD-10-CM | POA: Insufficient documentation

## 2020-05-07 MED ORDER — HYDROCODONE-ACETAMINOPHEN 5-325 MG PO TABS: 1.0000 | ORAL_TABLET | Freq: Two times a day (BID) | ORAL | 0 refills | Status: DC | PRN

## 2020-05-07 MED ORDER — HYDROCODONE-ACETAMINOPHEN 5-325 MG PO TABS
1.0000 | ORAL_TABLET | Freq: Once | ORAL | Status: AC
  Administered 2020-05-07: 1 via ORAL
  Filled 2020-05-07: qty 1

## 2020-05-07 NOTE — ED Triage Notes (Signed)
Pt c/o right knee pain with swelling below the knee x 1 week; denies injury

## 2020-05-07 NOTE — Discharge Instructions (Signed)
We saw you in the ER for knee pain. It is unclear what the underlying causes.  Our suspicion is that you likely have soft tissue injury such as ligamentous problems or meniscal injury.  We would recommend that you see the orthopedist for further evaluation.  Continue to take 400 to 600 mg ibuprofen every 8 hours for inflammation control.  You can take Tylenol in between for further pain control. Ice the knee 4 times a day for about 10 minutes as well and use the crutches for additional support along with the Ace wrap.  Use the narcotic pain medicine only if the pain is excruciating and you are unable to sleep.

## 2020-05-08 NOTE — ED Provider Notes (Signed)
[facility] EMERGENCY DEPARTMENT Provider Note   CSN: [id number] Arrival date & time: 05/07/20  1217     History Chief Complaint  Patient presents with  . Knee Pain    Walter [name] is a 55 y.o. male.  HPI     55 year old male comes in a chief complaint of knee pain.  Patient has history of hypertension and chronic pain.   Patient reports that over the last week that he has had only pain and swelling.  He denies any preceding trauma.  Over the last 3 days his pain has worsened, essentially making him bedbound.  He is unable to sleep well at night.  The pain is located right behind his kneecap.  Patient denies any associated nausea, vomiting, fevers, chills.  No history of severe knee injury or osteoarthritis.  No history of gout.  Patient does not take any medications that would weaken his immune system or has any condition that doing the same.  Past Medical History:  Diagnosis Date  . AC (acromioclavicular) joint bone spurs   . Arthritis   . Attention deficit disorder   . Bipolar 1 disorder ([place])   . Chronic pain   . Degenerative disc disease, lumbar   . Family history of adverse reaction to anesthesia    Pt son is combative " it runs in the family"  . 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 Hypertension   . Spinal stenosis   . Umbilical hernia     Patient Active Problem List   Diagnosis Date Noted  . OSA (obstructive sleep apnea) 03/15/2020  . Fatigue 12/09/2018  . Morbid obesity ([place]) 12/09/2018  . SOB (shortness of breath) 10/21/2018  . Testosterone deficiency 02/23/2016  . Essential hypertension 01/09/2016  . Erectile dysfunction 01/09/2016  . Chronic low back pain 06/10/2015  . Morbid obesity with BMI of 50.0-59.9, adult ([place]) 06/10/2015    Past Surgical History:  Procedure Laterality Date  . CYSTECTOMY    . INSERTION OF MESH N/A 08/06/2016   Procedure: INSERTION OF MESH;  Surgeon: Coralie Keens, MD;  Location: [place];  Service: General;   Laterality: N/A;  . MULTIPLE TOOTH EXTRACTIONS    . UMBILICAL HERNIA REPAIR N/A 08/06/2016   Procedure: REPAIR UMBILICAL HERNIA WITH MESH;  Surgeon: Coralie Keens, MD;  Location: [facility] OR;  Service: General;  Laterality: N/A;       Family History  Problem Relation Age of Onset  . Healthy Mother   . Healthy Sister   . Healthy Brother   . Healthy Brother     Social History   Tobacco Use  . Smoking status: Never Smoker  . Smokeless tobacco: Never Used  Vaping Use  . Vaping Use: Never used  Substance Use Topics  . Alcohol use: No    Comment:  " in the past"  . Drug use: Yes    Types: Marijuana    Comment: smokes once a week per pt--02/23/2020    Home Medications Prior to Admission medications   Medication Sig Start Date End Date Taking? Authorizing Provider  DULoxetine (CYMBALTA) 30 MG capsule Take 1 capsule (30 mg total) by mouth daily for 14 days, THEN 2 capsules (60 mg total) daily for 14 days. 10/14/19 11/11/19  Sharion Balloon, FNP  DULoxetine (CYMBALTA) 60 MG capsule Take 1 capsule (60 mg total) by mouth daily. 10/14/19   Sharion Balloon, FNP  HYDROcodone-acetaminophen (NORCO/VICODIN) 5-325 MG tablet Take 1 tablet by mouth every 12 (  twelve) hours as needed for severe pain. 05/07/20   Varney Biles, MD  lisinopril-hydrochlorothiazide (ZESTORETIC) 10-12.5 MG tablet TAKE 1 TABLET BY MOUTH ONCE DAILY.  Needs to be seen before next refill Patient not taking: No sig reported 10/14/19   Evelina Dun A, FNP  Testosterone Cypionate 200 MG/ML KIT Inject 200 mg into the muscle every 14 (fourteen) days. Patient not taking: No sig reported 10/31/16   Evelina Dun A, FNP    Allergies    No known allergies  Review of Systems   Review of Systems  Constitutional: Positive for activity change. Negative for fever.  Respiratory: Negative for shortness of breath.   Cardiovascular: Negative for chest pain.  Musculoskeletal: Positive for arthralgias and myalgias.   Allergic/Immunologic: Negative for immunocompromised state.  Hematological: Does not bruise/bleed easily.  All other systems reviewed and are negative.   Physical Exam Updated Vital Signs BP (!) 163/76   Pulse (!) 55   Temp 98.3 F (36.8 C) (Oral)   Resp 16   Ht 6' (1.829 m)   Wt (!) 181.4 kg   SpO2 91%   BMI 54.25 kg/m   Physical Exam Vitals and nursing note reviewed.  Constitutional:      Appearance: He is well-developed.  HENT:     Head: Atraumatic.  Cardiovascular:     Rate and Rhythm: Normal rate.  Pulmonary:     Effort: Pulmonary effort is normal.  Musculoskeletal:        General: Swelling and tenderness present.     Cervical back: Neck supple.     Comments: Mild swelling in the infrapatellar and suprapatellar region noted.  There is no warmth to the touch compared to the contralateral side and no erythema.  Patient's range of motion is quite good over the right knee with minimal compromise.  No palpable effusion.  Skin:    General: Skin is warm.  Neurological:     Mental Status: He is alert and oriented to person, place, and time.     ED Results / Procedures / Treatments   Labs (all labs ordered are listed, but only abnormal results are displayed) Labs Reviewed - No data to display  EKG None  Radiology DG Knee Complete 4 Views Right  Result Date: 05/07/2020 CLINICAL DATA:  Pt c/o right knee pain with swelling below the knee x 1 week; denies injury. Hx of hypertension. EXAM: RIGHT KNEE - COMPLETE 4+ VIEW COMPARISON:  None. FINDINGS: No evidence of fracture, dislocation, or joint effusion. Mild patellar spurring. No focal bone lesion. Soft tissues are unremarkable. IMPRESSION: No acute osseous abnormality in the right knee. Electronically Signed   By: Audie Pinto M.D.   On: 05/07/2020 13:18    Procedures Procedures   Medications Ordered in ED Medications  HYDROcodone-acetaminophen (NORCO/VICODIN) 5-325 MG per tablet 1 tablet (1 tablet Oral Given  05/07/20 1312)    ED Course  I have reviewed the triage vital signs and the nursing notes.  Pertinent labs & imaging results that were available during my care of the patient were reviewed by me and considered in my medical decision making (see chart for details).    MDM Rules/Calculators/A&P                          55 year old comes in a chief complaint of knee pain.  Patient is immunocompetent.  Symptoms started without any preceding trauma.  On exam there is mild swelling with no evidence of erythema, increased  warmth compared to contralateral side and the range of motion is fairly normal as well.  Doubt gout or septic arthritis.  X-rays ordered and they are negative.  Pain is in the posterior aspect of the knee as well, patient is concerned about blood clots.  He has no risk factors for the blood clots.  However, given the x-ray looks fairly normal it is possible that he could have Baker's cyst.  We have ordered an outpatient ultrasound DVT, but more importantly I think patient will need to see orthopedist to see if there is any meniscal or soft tissue injury that needs further work-up.  Patient has been provided with 6 Norco because he reports inability to fall asleep.  I have reviewed the [place] controlled substance database prior to giving him the prescription.  Final Clinical Impression(s) / ED Diagnoses Final diagnoses:  Acute pain of right knee    Rx / DC Orders ED Discharge Orders         Ordered    US Venous Img Lower Unilateral Left        05/07/20 1511    HYDROcodone-acetaminophen (NORCO/VICODIN) 5-325 MG tablet  Every 12 hours PRN,   Status:  Discontinued        05/07/20 1512    HYDROcodone-acetaminophen (NORCO/VICODIN) 5-325 MG tablet  Every 12 hours PRN        05/07/20 1607           Varney Biles, MD 05/08/20 0715

## 2020-06-06 ENCOUNTER — Other Ambulatory Visit (HOSPITAL_COMMUNITY): Payer: Self-pay | Admitting: Sports Medicine

## 2020-06-06 ENCOUNTER — Encounter (HOSPITAL_COMMUNITY): Payer: Medicaid Other

## 2020-06-06 ENCOUNTER — Ambulatory Visit (HOSPITAL_COMMUNITY)
Admission: RE | Admit: 2020-06-06 | Discharge: 2020-06-06 | Disposition: A | Discharge status: Home / Self Care | Payer: Medicaid Other | Source: Ambulatory Visit | Attending: Sports Medicine | Admitting: Sports Medicine

## 2020-06-06 ENCOUNTER — Other Ambulatory Visit: Payer: Self-pay

## 2020-06-06 DIAGNOSIS — M7989 Other specified soft tissue disorders: Secondary | ICD-10-CM | POA: Diagnosis not present

## 2020-06-06 DIAGNOSIS — M79604 Pain in right leg: Secondary | ICD-10-CM | POA: Insufficient documentation

## 2020-06-06 NOTE — Progress Notes (Signed)
VASCULAR LAB    Right lower extremity venous duplex has been performed.  See CV proc for preliminary results.  Called results to [facility] at [facility], [facility], RVT 06/06/2020, 11:31 AM

## 2020-08-12 ENCOUNTER — Ambulatory Visit: Payer: Medicaid Other | Admitting: Family

## 2020-08-16 ENCOUNTER — Encounter: Payer: Self-pay | Admitting: Family

## 2021-05-30 ENCOUNTER — Ambulatory Visit: Payer: Medicaid Other | Admitting: Family

## 2021-05-30 ENCOUNTER — Encounter: Payer: Self-pay | Admitting: Family

## 2021-05-30 VITALS — BP 181/87 | HR 62 | Ht 72.0 in | Wt 386.0 lb

## 2021-05-30 DIAGNOSIS — Z0000 Encounter for general adult medical examination without abnormal findings: Secondary | ICD-10-CM

## 2021-05-30 DIAGNOSIS — F321 Major depressive disorder, single episode, moderate: Secondary | ICD-10-CM | POA: Insufficient documentation

## 2021-05-30 DIAGNOSIS — I10 Essential (primary) hypertension: Secondary | ICD-10-CM | POA: Diagnosis not present

## 2021-05-30 DIAGNOSIS — G4733 Obstructive sleep apnea (adult) (pediatric): Secondary | ICD-10-CM

## 2021-05-30 DIAGNOSIS — Z0001 Encounter for general adult medical examination with abnormal findings: Secondary | ICD-10-CM

## 2021-05-30 DIAGNOSIS — F411 Generalized anxiety disorder: Secondary | ICD-10-CM

## 2021-05-30 DIAGNOSIS — Z6841 Body Mass Index (BMI) 40.0 and over, adult: Secondary | ICD-10-CM

## 2021-05-30 DIAGNOSIS — R1905 Periumbilic swelling, mass or lump: Secondary | ICD-10-CM

## 2021-05-30 DIAGNOSIS — Z1211 Encounter for screening for malignant neoplasm of colon: Secondary | ICD-10-CM

## 2021-05-30 DIAGNOSIS — R5383 Other fatigue: Secondary | ICD-10-CM

## 2021-05-30 DIAGNOSIS — Z1159 Encounter for screening for other viral diseases: Secondary | ICD-10-CM

## 2021-05-30 MED ORDER — LISINOPRIL-HYDROCHLOROTHIAZIDE 10-12.5 MG PO TABS: ORAL_TABLET | ORAL | 2 refills | Status: DC

## 2021-05-30 MED ORDER — DULOXETINE HCL 60 MG PO CPEP: 60.0000 mg | ORAL_CAPSULE | Freq: Every day | ORAL | 3 refills | Status: DC

## 2021-05-30 MED ORDER — DULOXETINE HCL 30 MG PO CPEP: ORAL_CAPSULE | ORAL | 0 refills | Status: DC

## 2021-05-30 NOTE — Progress Notes (Signed)
? ?Subjective:  ? ? Patient ID: Walter [name], male    DOB: [birth year], 56 y.o.   MRN: [id number] ? ?Chief Complaint  ?Patient presents with  ? Fatigue  ?  Symptom present for at least one year. Aware he needs to lose weight. Requesting labs- DM/Lyme's  ? ?Pt presents to the office today for CPE and  fatigue. He reports he lost about 30 lbs over the last year. He is still morbid obese. He is followed by Methadone clinic daily.  ? ?He has not taken any of his medications in over a year.  ? ?He has a OSA and does not use his machine regularly.  ? ?He mother passed in 12/2020. He has increased GAD and Depression.  ?Hypertension ?This is a chronic problem. The current episode started more than 1 year ago. The problem is unchanged. The problem is uncontrolled. Associated symptoms include anxiety and malaise/fatigue. Pertinent negatives include no peripheral edema or shortness of breath. Risk factors for coronary artery disease include obesity, male gender and sedentary lifestyle. The current treatment provides no improvement.  ?Anxiety ?Presents for follow-up visit. Symptoms include excessive worry, irritability, nervous/anxious behavior and restlessness. Patient reports no depressed mood or shortness of breath. Symptoms occur occasionally. The severity of symptoms is severe.  ? ? ?Depression ?       This is a chronic problem.  The current episode started more than 1 year ago.   Associated symptoms include fatigue, helplessness, hopelessness, restlessness and sad.  Past treatments include nothing.  Past medical history includes anxiety.   ? ? ? ?Review of Systems  ?Constitutional:  Positive for fatigue, irritability and malaise/fatigue.  ?Respiratory:  Negative for shortness of breath.   ?Psychiatric/Behavioral:  Positive for depression. The patient is nervous/anxious.   ?All other systems reviewed and are negative. ? ?Family History  ?Problem Relation Age of Onset  ? Healthy Mother   ? Healthy Sister   ? Healthy  Brother   ? Healthy Brother   ? ?Social History  ? ?Socioeconomic History  ? Marital status: Significant Other  ?  Spouse name: Not on file  ? Number of children: Not on file  ? Years of education: Not on file  ? Highest education level: Not on file  ?Occupational History  ? Not on file  ?Tobacco Use  ? Smoking status: Never  ? Smokeless tobacco: Never  ?Vaping Use  ? Vaping Use: Never used  ?Substance and Sexual Activity  ? Alcohol use: No  ?  Comment:  " in the past"  ? Drug use: Yes  ?  Types: Marijuana  ?  Comment: smokes once a week per pt--02/23/2020  ? Sexual activity: Not on file  ?Other Topics Concern  ? Not on file  ?Social History Narrative  ? Not on file  ? ?Social Determinants of Health  ? ?Financial Resource Strain: Not on file  ?Food Insecurity: Not on file  ?Transportation Needs: Not on file  ?Physical Activity: Not on file  ?Stress: Not on file  ?Social Connections: Not on file  ? ? ?   ?Objective:  ? Physical Exam ?Vitals reviewed.  ?Constitutional:   ?   General: He is not in acute distress. ?   Appearance: He is well-developed. He is obese.  ?HENT:  ?   Head: Normocephalic.  ?   Right Ear: Tympanic membrane normal.  ?   Left Ear: Tympanic membrane normal.  ?Eyes:  ?   General:     ?  Right eye: No discharge.     ?   Left eye: No discharge.  ?   Pupils: Pupils are equal, round, and reactive to light.  ?Neck:  ?   Thyroid: No thyromegaly.  ?Cardiovascular:  ?   Rate and Rhythm: Normal rate and regular rhythm.  ?   Heart sounds: Normal heart sounds. No murmur heard. ?Pulmonary:  ?   Effort: Pulmonary effort is normal. No respiratory distress.  ?   Breath sounds: Normal breath sounds. No wheezing.  ?Abdominal:  ?   General: Bowel sounds are normal. There is no distension.  ?   Palpations: Abdomen is soft. There is mass.  ?   Tenderness: There is no abdominal tenderness.  ? ? ?   Comments: Hard mass.   ?Musculoskeletal:     ?   General: No tenderness. Normal range of motion.  ?   Cervical back: Normal  range of motion and neck supple.  ?Skin: ?   General: Skin is warm and dry.  ?   Findings: No erythema or rash.  ?Neurological:  ?   Mental Status: He is alert and oriented to person, place, and time.  ?   Cranial Nerves: No cranial nerve deficit.  ?   Deep Tendon Reflexes: Reflexes are normal and symmetric.  ?Psychiatric:     ?   Behavior: Behavior normal.     ?   Thought Content: Thought content normal.     ?   Judgment: Judgment normal.  ? ? ? ? ?BP (!) 181/87   Pulse 62   Ht 6' (1.829 m)   Wt (!) 386 lb (175.1 kg)   SpO2 94%   BMI 52.35 kg/m?  ? ?   ?Assessment & Plan:  ?TOBE KERVIN comes in today with chief complaint of Fatigue (Symptom present for at least one year. Aware he needs to lose weight. Requesting labs- DM/Lyme's) ? ? ?Diagnosis and orders addressed: ? ?1. Annual physical exam ?- CMP14+EGFR ?- CBC with Differential/Platelet ?- Lipid panel ?- PSA, total and free ?- TSH ?- Hepatitis C antibody ?- Ambulatory referral to Gastroenterology ? ?2. Other fatigue ?- CMP14+EGFR ?- CBC with Differential/Platelet ? ?3. Essential hypertension ?Restart BP medications  ?-Daily blood pressure log given with instructions on how to fill out and told to bring to next visit ?-Dash diet information given ?-Exercise encouraged ?- Stress Management  ?-Continue current meds ?- lisinopril-hydrochlorothiazide (ZESTORETIC) 10-12.5 MG tablet; TAKE 1 TABLET BY MOUTH ONCE DAILY.  Needs to be seen before next refill  Dispense: 90 tablet; Refill: 2 ?- CMP14+EGFR ?- CBC with Differential/Platelet ? ?4. OSA (obstructive sleep apnea) ?Wear CPAP every day ?- CMP14+EGFR ?- CBC with Differential/Platelet ? ?5. Morbid obesity with BMI of 50.0-59.9, adult ([place]) ?- CMP14+EGFR ?- CBC with Differential/Platelet ? ?6. Need for hepatitis C screening test ?- CMP14+EGFR ?- CBC with Differential/Platelet ?- Hepatitis C antibody ? ?7. Colon cancer screening ?- CMP14+EGFR ?- CBC with Differential/Platelet ?- Ambulatory referral to  Gastroenterology ? ?8. GAD (generalized anxiety disorder) ?Restart Cymbalta 30 mg then increase to 60 mg  ?Stress management  ?- CMP14+EGFR ?- CBC with Differential/Platelet ?- DULoxetine (CYMBALTA) 30 MG capsule; Take 1 capsule (30 mg total) by mouth daily for 14 days, THEN 2 capsules (60 mg total) daily for 14 days.  Dispense: 42 capsule; Refill: 0 ?- DULoxetine (CYMBALTA) 60 MG capsule; Take 1 capsule (60 mg total) by mouth daily.  Dispense: 90 capsule; Refill: 3 ? ?9. Depression, major, single episode,  moderate ([place]) ?Restart Cymbalta 30 mg then increase to 60 mg  ?Stress management  ?- CMP14+EGFR ?- CBC with Differential/Platelet ?- DULoxetine (CYMBALTA) 30 MG capsule; Take 1 capsule (30 mg total) by mouth daily for 14 days, THEN 2 capsules (60 mg total) daily for 14 days.  Dispense: 42 capsule; Refill: 0 ?- DULoxetine (CYMBALTA) 60 MG capsule; Take 1 capsule (60 mg total) by mouth daily.  Dispense: 90 capsule; Refill: 3 ? ?10. Periumbilical mass ?[place] pending  ?- US Abdomen Limited; Future ? ? ?Labs pending ?Health Maintenance reviewed ?Diet and exercise encouraged ? ?Follow up plan: ?1 month to recheck HTN, Fatigue, GAD and depression  ? ? ?Evelina Dun, FNP ? ? ? ?

## 2021-05-30 NOTE — Patient Instructions (Signed)
Hypertension, Adult High blood pressure (hypertension) is when the force of blood pumping through the arteries is too strong. The arteries are the blood vessels that carry blood from the heart throughout the body. Hypertension forces the heart to work harder to pump blood and may cause arteries to become narrow or stiff. Untreated or uncontrolled hypertension can lead to a heart attack, heart failure, a stroke, kidney disease, and other problems. A blood pressure reading consists of a higher number over a lower number. Ideally, your blood pressure should be below 120/80. The first ("top") number is called the systolic pressure. It is a measure of the pressure in your arteries as your heart beats. The second ("bottom") number is called the diastolic pressure. It is a measure of the pressure in your arteries as the heart relaxes. What are the causes? The exact cause of this condition is not known. There are some conditions that result in high blood pressure. What increases the risk? Certain factors may make you more likely to develop high blood pressure. Some of these risk factors are under your control, including: Smoking. Not getting enough exercise or physical activity. Being overweight. Having too much fat, sugar, calories, or salt (sodium) in your diet. Drinking too much alcohol. Other risk factors include: Having a personal history of heart disease, diabetes, high cholesterol, or kidney disease. Stress. Having a family history of high blood pressure and high cholesterol. Having obstructive sleep apnea. Age. The risk increases with age. What are the signs or symptoms? High blood pressure may not cause symptoms. Very high blood pressure (hypertensive crisis) may cause: Headache. Fast or irregular heartbeats (palpitations). Shortness of breath. Nosebleed. Nausea and vomiting. Vision changes. Severe chest pain, dizziness, and seizures. How is this diagnosed? This condition is diagnosed by  measuring your blood pressure while you are seated, with your arm resting on a flat surface, your legs uncrossed, and your feet flat on the floor. The cuff of the blood pressure monitor will be placed directly against the skin of your upper arm at the level of your heart. Blood pressure should be measured at least twice using the same arm. Certain conditions can cause a difference in blood pressure between your right and left arms. If you have a high blood pressure reading during one visit or you have normal blood pressure with other risk factors, you may be asked to: Return on a different day to have your blood pressure checked again. Monitor your blood pressure at home for 1 week or longer. If you are diagnosed with hypertension, you may have other blood or imaging tests to help your health care provider understand your overall risk for other conditions. How is this treated? This condition is treated by making healthy lifestyle changes, such as eating healthy foods, exercising more, and reducing your alcohol intake. You may be referred for counseling on a healthy diet and physical activity. Your health care provider may prescribe medicine if lifestyle changes are not enough to get your blood pressure under control and if: Your systolic blood pressure is above 130. Your diastolic blood pressure is above 80. Your personal target blood pressure may vary depending on your medical conditions, your age, and other factors. Follow these instructions at home: Eating and drinking  Eat a diet that is high in fiber and potassium, and low in sodium, added sugar, and fat. An example of this eating plan is called the DASH diet. DASH stands for Dietary Approaches to Stop Hypertension. To eat this way: Eat   plenty of fresh fruits and vegetables. Try to fill one half of your plate at each meal with fruits and vegetables. Eat whole grains, such as whole-wheat pasta, brown rice, or whole-grain bread. Fill about one  fourth of your plate with whole grains. Eat or drink low-fat dairy products, such as skim milk or low-fat yogurt. Avoid fatty cuts of meat, processed or cured meats, and poultry with skin. Fill about one fourth of your plate with lean proteins, such as fish, chicken without skin, beans, eggs, or tofu. Avoid pre-made and processed foods. These tend to be higher in sodium, added sugar, and fat. Reduce your daily sodium intake. Many people with hypertension should eat less than 1,500 mg of sodium a day. Do not drink alcohol if: Your health care provider tells you not to drink. You are pregnant, may be pregnant, or are planning to become pregnant. If you drink alcohol: Limit how much you have to: 0-1 drink a day for women. 0-2 drinks a day for men. Know how much alcohol is in your drink. In the [place] one drink equals one 12 oz bottle of beer (355 mL), one 5 oz glass of wine (148 mL), or one 1 oz glass of hard liquor (44 mL). Lifestyle  Work with your health care provider to maintain a healthy body weight or to lose weight. Ask what an ideal weight is for you. Get at least 30 minutes of exercise that causes your heart to beat faster (aerobic exercise) most days of the week. Activities may include walking, swimming, or biking. Include exercise to strengthen your muscles (resistance exercise), such as Pilates or lifting weights, as part of your weekly exercise routine. Try to do these types of exercises for 30 minutes at least 3 days a week. Do not use any products that contain nicotine or tobacco. These products include cigarettes, chewing tobacco, and vaping devices, such as e-cigarettes. If you need help quitting, ask your health care provider. Monitor your blood pressure at home as told by your health care provider. Keep all follow-up visits. This is important. Medicines Take over-the-counter and prescription medicines only as told by your health care provider. Follow directions carefully. Blood  pressure medicines must be taken as prescribed. Do not skip doses of blood pressure medicine. Doing this puts you at risk for problems and can make the medicine less effective. Ask your health care provider about side effects or reactions to medicines that you should watch for. Contact a health care provider if you: Think you are having a reaction to a medicine you are taking. Have headaches that keep coming back (recurring). Feel dizzy. Have swelling in your ankles. Have trouble with your vision. Get help right away if you: Develop a severe headache or confusion. Have unusual weakness or numbness. Feel faint. Have severe pain in your chest or abdomen. Vomit repeatedly. Have trouble breathing. These symptoms may be an emergency. Get help right away. Call 911. Do not wait to see if the symptoms will go away. Do not drive yourself to the hospital. Summary Hypertension is when the force of blood pumping through your arteries is too strong. If this condition is not controlled, it may put you at risk for serious complications. Your personal target blood pressure may vary depending on your medical conditions, your age, and other factors. For most people, a normal blood pressure is less than 120/80. Hypertension is treated with lifestyle changes, medicines, or a combination of both. Lifestyle changes include losing weight, eating a healthy,   low-sodium diet, exercising more, and limiting alcohol. This information is not intended to replace advice given to you by your health care provider. Make sure you discuss any questions you have with your health care provider. Document Revised: 11/15/2020 Document Reviewed: 11/15/2020 Elsevier Patient Education  2023 [employer]  

## 2021-05-31 LAB — CBC WITH DIFFERENTIAL/PLATELET
Basophils Absolute: 0 10*3/uL (ref 0.0–0.2)
Basos: 1 %
EOS (ABSOLUTE): 0.4 10*3/uL (ref 0.0–0.4)
Eos: 5 %
Hematocrit: 39.3 % (ref 37.5–51.0)
Hemoglobin: 13.1 g/dL (ref 13.0–17.7)
Immature Grans (Abs): 0.1 10*3/uL (ref 0.0–0.1)
Immature Granulocytes: 1 %
Lymphocytes Absolute: 1.6 10*3/uL (ref 0.7–3.1)
Lymphs: 23 %
MCH: 29.1 pg (ref 26.6–33.0)
MCHC: 33.3 g/dL (ref 31.5–35.7)
MCV: 87 fL (ref 79–97)
Monocytes Absolute: 0.7 10*3/uL (ref 0.1–0.9)
Monocytes: 10 %
Neutrophils Absolute: 4.3 10*3/uL (ref 1.4–7.0)
Neutrophils: 60 %
Platelets: 181 10*3/uL (ref 150–450)
RBC: 4.5 x10E6/uL (ref 4.14–5.80)
RDW: 12.9 % (ref 11.6–15.4)
WBC: 7 10*3/uL (ref 3.4–10.8)

## 2021-05-31 LAB — CMP14+EGFR
ALT: 11 IU/L (ref 0–44)
AST: 13 IU/L (ref 0–40)
Albumin/Globulin Ratio: 1.4 (ref 1.2–2.2)
Albumin: 3.9 g/dL (ref 3.8–4.9)
Alkaline Phosphatase: 63 IU/L (ref 44–121)
BUN/Creatinine Ratio: 11 (ref 9–20)
BUN: 7 mg/dL (ref 6–24)
Bilirubin Total: 0.2 mg/dL (ref 0.0–1.2)
CO2: 27 mmol/L (ref 20–29)
Calcium: 9.4 mg/dL (ref 8.7–10.2)
Chloride: 98 mmol/L (ref 96–106)
Creatinine, Ser: 0.66 mg/dL — ABNORMAL LOW (ref 0.76–1.27)
Globulin, Total: 2.7 g/dL (ref 1.5–4.5)
Glucose: 110 mg/dL — ABNORMAL HIGH (ref 70–99)
Potassium: 4.2 mmol/L (ref 3.5–5.2)
Sodium: 138 mmol/L (ref 134–144)
Total Protein: 6.6 g/dL (ref 6.0–8.5)
eGFR: 111 mL/min/{1.73_m2} (ref >59)

## 2021-05-31 LAB — HEPATITIS C ANTIBODY: Hep C Virus Ab: NONREACTIVE

## 2021-05-31 LAB — LIPID PANEL
Chol/HDL Ratio: 5.6 ratio — ABNORMAL HIGH (ref 0.0–5.0)
Cholesterol, Total: 169 mg/dL (ref 100–199)
HDL: 30 mg/dL — ABNORMAL LOW (ref >39)
LDL Chol Calc (NIH): 101 mg/dL — ABNORMAL HIGH (ref 0–99)
Triglycerides: 219 mg/dL — ABNORMAL HIGH (ref 0–149)
VLDL Cholesterol Cal: 38 mg/dL (ref 5–40)

## 2021-05-31 LAB — PSA, TOTAL AND FREE
PSA, Free Pct: 17.8 %
PSA, Free: 0.16 ng/mL
Prostate Specific Ag, Serum: 0.9 ng/mL (ref 0.0–4.0)

## 2021-05-31 LAB — TSH: TSH: 1.52 u[IU]/mL (ref 0.450–4.500)

## 2021-06-01 ENCOUNTER — Other Ambulatory Visit: Payer: Self-pay | Admitting: Family

## 2021-06-01 MED ORDER — ROSUVASTATIN CALCIUM 5 MG PO TABS: 5.0000 mg | ORAL_TABLET | Freq: Every day | ORAL | 3 refills | Status: DC

## 2021-06-02 LAB — HGB A1C W/O EAG: Hgb A1c MFr Bld: 5.9 % — ABNORMAL HIGH (ref 4.8–5.6)

## 2021-06-02 LAB — SPECIMEN STATUS REPORT

## 2021-06-05 ENCOUNTER — Other Ambulatory Visit: Payer: Self-pay | Admitting: Family

## 2021-06-05 ENCOUNTER — Encounter (INDEPENDENT_AMBULATORY_CARE_PROVIDER_SITE_OTHER): Payer: Self-pay | Admitting: *Deleted

## 2021-06-05 DIAGNOSIS — R7303 Prediabetes: Secondary | ICD-10-CM | POA: Insufficient documentation

## 2021-06-21 ENCOUNTER — Ambulatory Visit (HOSPITAL_COMMUNITY): Payer: Medicaid Other

## 2021-07-03 ENCOUNTER — Encounter: Payer: Self-pay | Admitting: Family

## 2021-07-03 ENCOUNTER — Telehealth (INDEPENDENT_AMBULATORY_CARE_PROVIDER_SITE_OTHER): Payer: Medicaid Other | Admitting: Family

## 2021-07-03 DIAGNOSIS — I10 Essential (primary) hypertension: Secondary | ICD-10-CM

## 2021-07-03 DIAGNOSIS — S30861D Insect bite (nonvenomous) of abdominal wall, subsequent encounter: Secondary | ICD-10-CM

## 2021-07-03 DIAGNOSIS — I700 Atherosclerosis of aorta: Secondary | ICD-10-CM

## 2021-07-03 DIAGNOSIS — Z09 Encounter for follow-up examination after completed treatment for conditions other than malignant neoplasm: Secondary | ICD-10-CM

## 2021-07-03 DIAGNOSIS — Z6841 Body Mass Index (BMI) 40.0 and over, adult: Secondary | ICD-10-CM

## 2021-07-03 DIAGNOSIS — K429 Umbilical hernia without obstruction or gangrene: Secondary | ICD-10-CM

## 2021-07-03 DIAGNOSIS — W57XXXD Bitten or stung by nonvenomous insect and other nonvenomous arthropods, subsequent encounter: Secondary | ICD-10-CM

## 2021-07-03 MED ORDER — LISINOPRIL-HYDROCHLOROTHIAZIDE 10-12.5 MG PO TABS: 2.0000 | ORAL_TABLET | Freq: Every day | ORAL | 2 refills | Status: DC

## 2021-07-03 NOTE — Patient Instructions (Signed)
Hypertension, Adult High blood pressure (hypertension) is when the force of blood pumping through the arteries is too strong. The arteries are the blood vessels that carry blood from the heart throughout the body. Hypertension forces the heart to work harder to pump blood and may cause arteries to become narrow or stiff. Untreated or uncontrolled hypertension can lead to a heart attack, heart failure, a stroke, kidney disease, and other problems. A blood pressure reading consists of a higher number over a lower number. Ideally, your blood pressure should be below 120/80. The first ("top") number is called the systolic pressure. It is a measure of the pressure in your arteries as your heart beats. The second ("bottom") number is called the diastolic pressure. It is a measure of the pressure in your arteries as the heart relaxes. What are the causes? The exact cause of this condition is not known. There are some conditions that result in high blood pressure. What increases the risk? Certain factors may make you more likely to develop high blood pressure. Some of these risk factors are under your control, including: Smoking. Not getting enough exercise or physical activity. Being overweight. Having too much fat, sugar, calories, or salt (sodium) in your diet. Drinking too much alcohol. Other risk factors include: Having a personal history of heart disease, diabetes, high cholesterol, or kidney disease. Stress. Having a family history of high blood pressure and high cholesterol. Having obstructive sleep apnea. Age. The risk increases with age. What are the signs or symptoms? High blood pressure may not cause symptoms. Very high blood pressure (hypertensive crisis) may cause: Headache. Fast or irregular heartbeats (palpitations). Shortness of breath. Nosebleed. Nausea and vomiting. Vision changes. Severe chest pain, dizziness, and seizures. How is this diagnosed? This condition is diagnosed by  measuring your blood pressure while you are seated, with your arm resting on a flat surface, your legs uncrossed, and your feet flat on the floor. The cuff of the blood pressure monitor will be placed directly against the skin of your upper arm at the level of your heart. Blood pressure should be measured at least twice using the same arm. Certain conditions can cause a difference in blood pressure between your right and left arms. If you have a high blood pressure reading during one visit or you have normal blood pressure with other risk factors, you may be asked to: Return on a different day to have your blood pressure checked again. Monitor your blood pressure at home for 1 week or longer. If you are diagnosed with hypertension, you may have other blood or imaging tests to help your health care provider understand your overall risk for other conditions. How is this treated? This condition is treated by making healthy lifestyle changes, such as eating healthy foods, exercising more, and reducing your alcohol intake. You may be referred for counseling on a healthy diet and physical activity. Your health care provider may prescribe medicine if lifestyle changes are not enough to get your blood pressure under control and if: Your systolic blood pressure is above 130. Your diastolic blood pressure is above 80. Your personal target blood pressure may vary depending on your medical conditions, your age, and other factors. Follow these instructions at home: Eating and drinking  Eat a diet that is high in fiber and potassium, and low in sodium, added sugar, and fat. An example of this eating plan is called the DASH diet. DASH stands for Dietary Approaches to Stop Hypertension. To eat this way: Eat   plenty of fresh fruits and vegetables. Try to fill one half of your plate at each meal with fruits and vegetables. Eat whole grains, such as whole-wheat pasta, brown rice, or whole-grain bread. Fill about one  fourth of your plate with whole grains. Eat or drink low-fat dairy products, such as skim milk or low-fat yogurt. Avoid fatty cuts of meat, processed or cured meats, and poultry with skin. Fill about one fourth of your plate with lean proteins, such as fish, chicken without skin, beans, eggs, or tofu. Avoid pre-made and processed foods. These tend to be higher in sodium, added sugar, and fat. Reduce your daily sodium intake. Many people with hypertension should eat less than 1,500 mg of sodium a day. Do not drink alcohol if: Your health care provider tells you not to drink. You are pregnant, may be pregnant, or are planning to become pregnant. If you drink alcohol: Limit how much you have to: 0-1 drink a day for women. 0-2 drinks a day for men. Know how much alcohol is in your drink. In the [place] one drink equals one 12 oz bottle of beer (355 mL), one 5 oz glass of wine (148 mL), or one 1 oz glass of hard liquor (44 mL). Lifestyle  Work with your health care provider to maintain a healthy body weight or to lose weight. Ask what an ideal weight is for you. Get at least 30 minutes of exercise that causes your heart to beat faster (aerobic exercise) most days of the week. Activities may include walking, swimming, or biking. Include exercise to strengthen your muscles (resistance exercise), such as Pilates or lifting weights, as part of your weekly exercise routine. Try to do these types of exercises for 30 minutes at least 3 days a week. Do not use any products that contain nicotine or tobacco. These products include cigarettes, chewing tobacco, and vaping devices, such as e-cigarettes. If you need help quitting, ask your health care provider. Monitor your blood pressure at home as told by your health care provider. Keep all follow-up visits. This is important. Medicines Take over-the-counter and prescription medicines only as told by your health care provider. Follow directions carefully. Blood  pressure medicines must be taken as prescribed. Do not skip doses of blood pressure medicine. Doing this puts you at risk for problems and can make the medicine less effective. Ask your health care provider about side effects or reactions to medicines that you should watch for. Contact a health care provider if you: Think you are having a reaction to a medicine you are taking. Have headaches that keep coming back (recurring). Feel dizzy. Have swelling in your ankles. Have trouble with your vision. Get help right away if you: Develop a severe headache or confusion. Have unusual weakness or numbness. Feel faint. Have severe pain in your chest or abdomen. Vomit repeatedly. Have trouble breathing. These symptoms may be an emergency. Get help right away. Call 911. Do not wait to see if the symptoms will go away. Do not drive yourself to the hospital. Summary Hypertension is when the force of blood pumping through your arteries is too strong. If this condition is not controlled, it may put you at risk for serious complications. Your personal target blood pressure may vary depending on your medical conditions, your age, and other factors. For most people, a normal blood pressure is less than 120/80. Hypertension is treated with lifestyle changes, medicines, or a combination of both. Lifestyle changes include losing weight, eating a healthy,   low-sodium diet, exercising more, and limiting alcohol. This information is not intended to replace advice given to you by your health care provider. Make sure you discuss any questions you have with your health care provider. Document Revised: 11/15/2020 Document Reviewed: 11/15/2020 Elsevier Patient Education  2023 [employer]  

## 2021-07-03 NOTE — Progress Notes (Signed)
Virtual Visit Consent   Walter [name], you are scheduled for a virtual visit with a [facility] provider today. Just as with appointments in the office, your consent must be obtained to participate. Your consent will be active for this visit and any virtual visit you may have with one of our providers in the next 365 days. If you have a MyChart account, a copy of this consent can be sent to you electronically.  As this is a virtual visit, video technology does not allow for your provider to perform a traditional examination. This may limit your provider's ability to fully assess your condition. If your provider identifies any concerns that need to be evaluated in person or the need to arrange testing (such as labs, EKG, etc.), we will make arrangements to do so. Although advances in technology are sophisticated, we cannot ensure that it will always work on either your end or our end. If the connection with a video visit is poor, the visit may have to be switched to a telephone visit. With either a video or telephone visit, we are not always able to ensure that we have a secure connection.  By engaging in this virtual visit, you consent to the provision of healthcare and authorize for your insurance to be billed (if applicable) for the services provided during this visit. Depending on your insurance coverage, you may receive a charge related to this service.  I need to obtain your verbal consent now. Are you willing to proceed with your visit today? Walter [name] has provided verbal consent on 07/03/2021 for a virtual visit (video or telephone). Jannifer Rodneyhristy Shalaunda Weatherholtz, FNP  Date: 07/03/2021 3:22 PM  Virtual Visit via Video Note   I, Jannifer Rodneyhristy Makinzi Prieur, connected with  Walter [name]  ([id number], 1965/11/04) on 07/03/21 at  3:25 PM EDT by a video-enabled telemedicine application and verified that I am speaking with the correct person using two identifiers.  Location: Patient: Virtual Visit Location  Patient: Home Provider: Virtual Visit Location Provider: Office/Clinic   I discussed the limitations of evaluation and management by telemedicine and the availability of in person appointments. The patient expressed understanding and agreed to proceed.    History of Present Illness: Walter [name] is a 56 y.o. who identifies as a male who was assigned male at birth, and is being seen today for follow up. He was seen on 05/30/21 and we restarted his lisinopril-HCTZ 10-12.5.   I started Cymbalta 30 mg and was to increase to 60 mg. However, he never started these because he was scared.   Since he went to the ED on 06/19/21 for a tick bite on his abdomen. He was given doxycycline. He completed this. He had a CT abdomen that showed, "1. No acute findings are noted in the abdomen or pelvis to account  for the patient's symptoms.  2. Mild colonic diverticulosis without evidence of acute  diverticulitis at this time.  3. Large umbilical hernia containing only omental fat. No associated  bowel incarceration or obstruction at this time.  4. Multiple prominent borderline enlarged and mildly enlarged pelvic  and right inguinal lymph nodes, as above. This is nonspecific.  Correlation with PSA levels and digital prostate examination is  recommended. Additionally, clinical correlation for signs and  symptoms of lymphoproliferative disorder is suggested.  5. Aortic atherosclerosis. " HPI: Hypertension This is a chronic problem. The current episode started more than 1 year ago. The problem has been waxing and waning since onset. The  problem is uncontrolled. Associated symptoms include anxiety and malaise/fatigue. Pertinent negatives include no peripheral edema or shortness of breath. Risk factors for coronary artery disease include dyslipidemia, obesity, male gender and sedentary lifestyle. The current treatment provides mild improvement. There is no history of heart failure.  Anxiety Presents for  follow-up visit. Symptoms include depressed mood, excessive worry, irritability, nervous/anxious behavior and restlessness. Patient reports no shortness of breath. Symptoms occur occasionally. The severity of symptoms is moderate.    Depression        This is a chronic problem.  The current episode started more than 1 year ago.   The onset quality is gradual.   The problem occurs intermittently.  Associated symptoms include fatigue, helplessness, hopelessness, irritable and restlessness.  Past treatments include nothing.  Past medical history includes anxiety.     Problems:  Patient Active Problem List   Diagnosis Date Noted   Aortic atherosclerosis (HCC) 07/03/2021   Prediabetes 06/05/2021   GAD (generalized anxiety disorder) 05/30/2021   Depression, major, single episode, moderate (HCC) 05/30/2021   OSA (obstructive sleep apnea) 03/15/2020   Fatigue 12/09/2018   SOB (shortness of breath) 10/21/2018   Testosterone deficiency 02/23/2016   Essential hypertension 01/09/2016   Erectile dysfunction 01/09/2016   Chronic low back pain 06/10/2015   Morbid obesity with BMI of 50.0-59.9, adult (HCC) 06/10/2015    Allergies:  Allergies  Allergen Reactions   No Known Allergies    Medications:  Current Outpatient Medications:    DULoxetine (CYMBALTA) 30 MG capsule, Take 1 capsule (30 mg total) by mouth daily for 14 days, THEN 2 capsules (60 mg total) daily for 14 days., Disp: 42 capsule, Rfl: 0   DULoxetine (CYMBALTA) 60 MG capsule, Take 1 capsule (60 mg total) by mouth daily. (Patient not taking: Reported on 07/03/2021), Disp: 90 capsule, Rfl: 3   lisinopril-hydrochlorothiazide (ZESTORETIC) 10-12.5 MG tablet, Take 2 tablets by mouth daily. TAKE 1 TABLET BY MOUTH ONCE DAILY.  Needs to be seen before next refill, Disp: 180 tablet, Rfl: 2   rosuvastatin (CRESTOR) 5 MG tablet, Take 1 tablet (5 mg total) by mouth daily., Disp: 90 tablet, Rfl: 3  Observations/Objective: Patient is well-developed,  well-nourished in no acute distress.  Resting comfortably  at home.  Head is normocephalic, atraumatic.  No labored breathing.  Speech is clear and coherent with logical content.  Patient is alert and oriented at baseline.  Anxious   Assessment and Plan: 1. Aortic atherosclerosis (HCC)  2. Umbilical hernia without obstruction and without gangrene  3. Essential hypertension - lisinopril-hydrochlorothiazide (ZESTORETIC) 10-12.5 MG tablet; Take 2 tablets by mouth daily. TAKE 1 TABLET BY MOUTH ONCE DAILY.  Needs to be seen before next refill  Dispense: 180 tablet; Refill: 2  4. Morbid obesity with BMI of 50.0-59.9, adult (HCC)  5. Tick bite of abdominal wall, subsequent encounter  6. Hospital discharge follow-up  Will increase Lisinopril-HCTZ to 20-25 mg from 10-12.5 mg  -Dash diet information given -Exercise encouraged - Stress Management  - Restart Cymbalta 30 mg  - Does not want referral to General surgery at this time. Wants to get through some of this other stuff first then will follow up. Red flags discussed.  RTO in 2 weeks.    Follow Up Instructions: I discussed the assessment and treatment plan with the patient. The patient was provided an opportunity to ask questions and all were answered. The patient agreed with the plan and demonstrated an understanding of the instructions.  A copy of instructions  were sent to the patient via MyChart unless otherwise noted below.     The patient was advised to call back or seek an in-person evaluation if the symptoms worsen or if the condition fails to improve as anticipated.  Time:  I spent 19 minutes with the patient via telehealth technology discussing the above problems/concerns.    Jannifer Rodney, FNP

## 2021-08-15 ENCOUNTER — Ambulatory Visit: Payer: Medicaid Other | Admitting: Family

## 2021-08-29 ENCOUNTER — Ambulatory Visit: Payer: Medicaid Other | Admitting: Family

## 2021-08-30 ENCOUNTER — Encounter: Payer: Self-pay | Admitting: Family

## 2021-11-27 ENCOUNTER — Encounter (INDEPENDENT_AMBULATORY_CARE_PROVIDER_SITE_OTHER): Payer: Self-pay | Admitting: *Deleted

## 2022-04-24 ENCOUNTER — Telehealth: Payer: Self-pay

## 2022-04-24 NOTE — Transitions of Care (Post Inpatient/ED Visit) (Signed)
   04/24/2022  Name: Walter [name] MRN: [id number] DOB: March 20, 1965  Today's TOC FU Call Status: Today's TOC FU Call Status:: Successful TOC FU Call Competed TOC FU Call Complete Date: 04/24/22  Transition Care Management Follow-up Telephone Call Date of Discharge: 04/22/22 Discharge Facility: Other ([facility]) Name of Other (Non-[facility]) Discharge Facility: [facility] Type of Discharge: Emergency Department Reason for ED Visit: Other: (dental caries) How have you been since you were released from the hospital?: Same Any questions or concerns?: No  Items Reviewed: Did you receive and understand the discharge instructions provided?: Yes Medications obtained and verified?: Yes (Medications Reviewed) Any new allergies since your discharge?: No Dietary orders reviewed?: NA Do you have support at home?: No  Home Care and Equipment/Supplies: [facility] Ordered?: NA Any new equipment or medical supplies ordered?: NA  Functional Questionnaire: Do you need assistance with bathing/showering or dressing?: No Do you need assistance with meal preparation?: No Do you need assistance with eating?: No Do you have difficulty maintaining continence: No Do you need assistance with getting out of bed/getting out of a chair/moving?: No Do you have difficulty managing or taking your medications?: No  Follow up appointments reviewed: PCP Follow-up appointment confirmed?: [facility] Follow-up appointment confirmed?: Yes Date of Specialist follow-up appointment?: 04/24/22 Follow-Up Specialty Provider:: Dentist Do you need transportation to your follow-up appointment?: No Do you understand care options if your condition(s) worsen?: Yes-patient verbalized understanding    Day, [place] Nurse Health Advisor Direct Dial [phone]

## 2022-05-22 IMAGING — DX DG KNEE COMPLETE 4+V*R*
4 series · 4 of 4 positions shown · non-contrast
Comparison: None.

CLINICAL DATA: Pt c/o right knee pain with swelling below the knee
x 1 week; denies injury. Hx of hypertension.

EXAM:
RIGHT KNEE - COMPLETE 4+ VIEW

[knee ap]
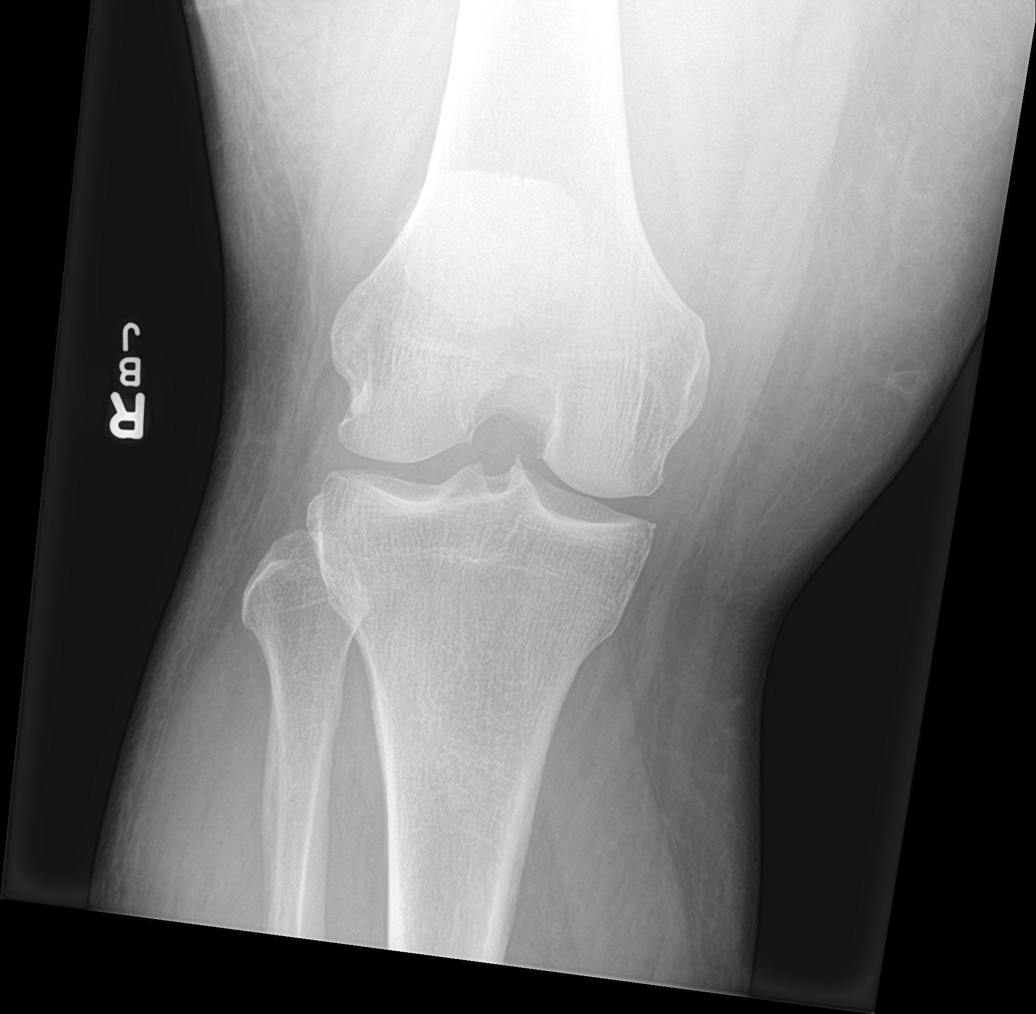

[knee obl (1 of 2)]
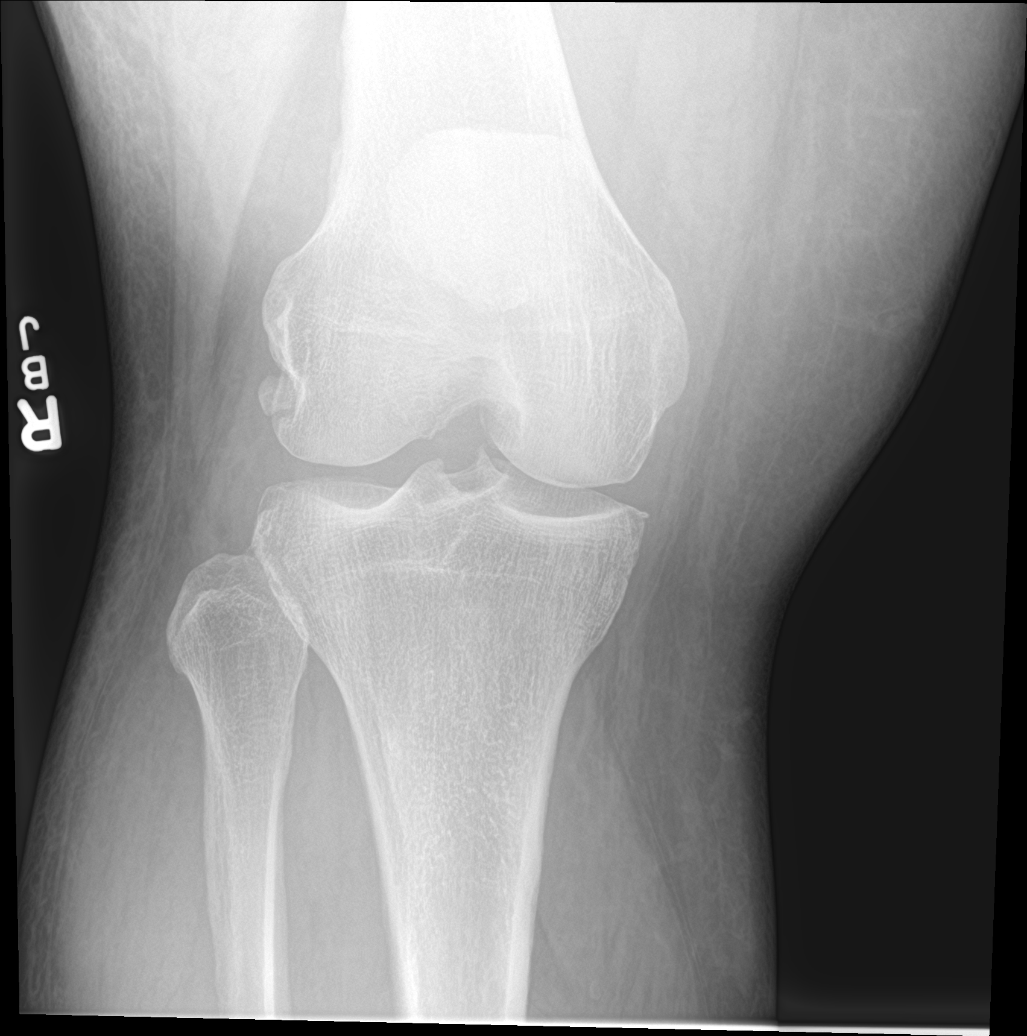

[knee obl (2 of 2)]
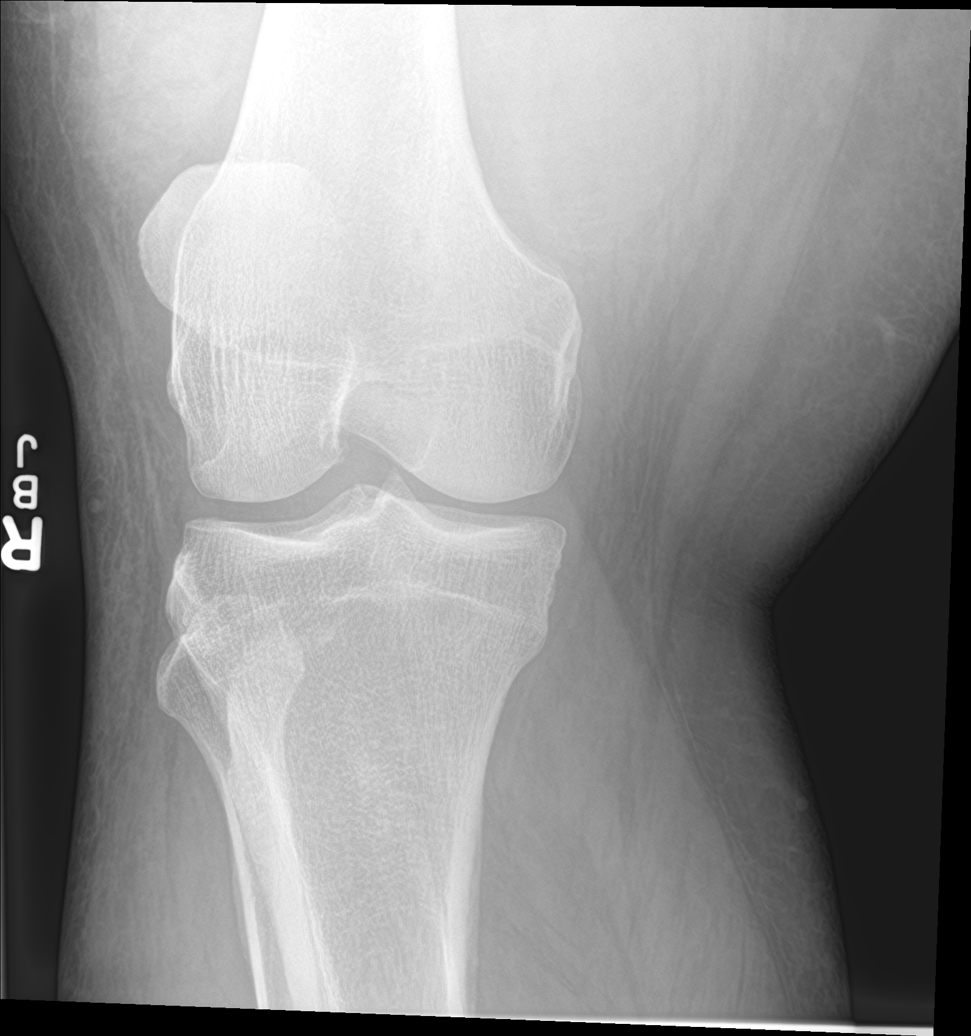

[knee lat]
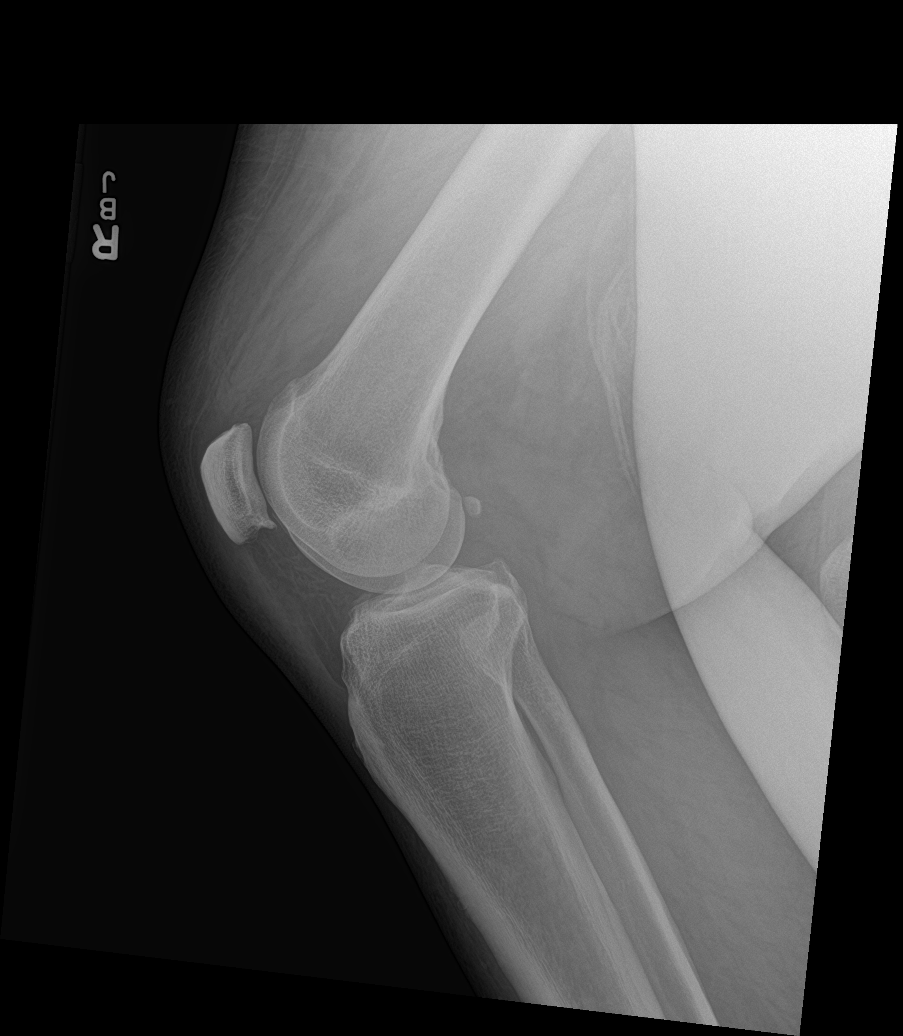

[4 of 4 positions shown; findings below may reference images not displayed]

FINDINGS: No evidence of fracture, dislocation, or joint effusion. Mild
patellar spurring. No focal bone lesion. Soft tissues are
unremarkable.
IMPRESSION: No acute osseous abnormality in the right knee.

## 2022-05-28 ENCOUNTER — Other Ambulatory Visit: Payer: Self-pay | Admitting: Family

## 2022-05-28 DIAGNOSIS — I10 Essential (primary) hypertension: Secondary | ICD-10-CM

## 2022-06-07 MED ORDER — LISINOPRIL-HYDROCHLOROTHIAZIDE 10-12.5 MG PO TABS: 2.0000 | ORAL_TABLET | Freq: Every day | ORAL | 0 refills | Status: DC

## 2022-06-07 MED ORDER — ROSUVASTATIN CALCIUM 5 MG PO TABS: 5.0000 mg | ORAL_TABLET | Freq: Every day | ORAL | 0 refills | Status: DC

## 2022-06-07 NOTE — Telephone Encounter (Signed)
Pt schedule next available apt for 06/29/2022 on mychart. He has been out of rx and headhurts. Can a refill for bp rx be called in till apt? Use [facility] in [place]

## 2022-06-07 NOTE — Addendum Note (Signed)
Addended by: Julious Payer D on: 06/07/2022 01:14 PM   Modules accepted: Orders

## 2022-06-29 ENCOUNTER — Encounter: Payer: Self-pay | Admitting: Family

## 2022-06-29 ENCOUNTER — Ambulatory Visit (INDEPENDENT_AMBULATORY_CARE_PROVIDER_SITE_OTHER): Payer: Medicaid Other | Admitting: Family

## 2022-06-29 VITALS — BP 123/74 | HR 76 | Temp 97.5°F | Ht 72.0 in | Wt 375.0 lb

## 2022-06-29 DIAGNOSIS — G4733 Obstructive sleep apnea (adult) (pediatric): Secondary | ICD-10-CM

## 2022-06-29 DIAGNOSIS — I700 Atherosclerosis of aorta: Secondary | ICD-10-CM | POA: Diagnosis not present

## 2022-06-29 DIAGNOSIS — R110 Nausea: Secondary | ICD-10-CM

## 2022-06-29 DIAGNOSIS — M5442 Lumbago with sciatica, left side: Secondary | ICD-10-CM

## 2022-06-29 DIAGNOSIS — F1120 Opioid dependence, uncomplicated: Secondary | ICD-10-CM

## 2022-06-29 DIAGNOSIS — I10 Essential (primary) hypertension: Secondary | ICD-10-CM | POA: Diagnosis not present

## 2022-06-29 DIAGNOSIS — G8929 Other chronic pain: Secondary | ICD-10-CM

## 2022-06-29 DIAGNOSIS — Z0001 Encounter for general adult medical examination with abnormal findings: Secondary | ICD-10-CM | POA: Diagnosis not present

## 2022-06-29 DIAGNOSIS — M170 Bilateral primary osteoarthritis of knee: Secondary | ICD-10-CM

## 2022-06-29 DIAGNOSIS — F411 Generalized anxiety disorder: Secondary | ICD-10-CM

## 2022-06-29 DIAGNOSIS — Z6841 Body Mass Index (BMI) 40.0 and over, adult: Secondary | ICD-10-CM

## 2022-06-29 DIAGNOSIS — E785 Hyperlipidemia, unspecified: Secondary | ICD-10-CM | POA: Insufficient documentation

## 2022-06-29 DIAGNOSIS — M5412 Radiculopathy, cervical region: Secondary | ICD-10-CM

## 2022-06-29 DIAGNOSIS — F321 Major depressive disorder, single episode, moderate: Secondary | ICD-10-CM

## 2022-06-29 DIAGNOSIS — Z1211 Encounter for screening for malignant neoplasm of colon: Secondary | ICD-10-CM

## 2022-06-29 DIAGNOSIS — Z0000 Encounter for general adult medical examination without abnormal findings: Secondary | ICD-10-CM

## 2022-06-29 MED ORDER — ROSUVASTATIN CALCIUM 5 MG PO TABS
5.0000 mg | ORAL_TABLET | Freq: Every day | ORAL | 0 refills | Status: DC
Start: 2022-06-29 — End: 2023-02-18

## 2022-06-29 MED ORDER — PREDNISONE 10 MG (21) PO TBPK
ORAL_TABLET | ORAL | 0 refills | Status: DC
Start: 2022-06-29 — End: 2022-10-02

## 2022-06-29 MED ORDER — GABAPENTIN 300 MG PO CAPS
300.0000 mg | ORAL_CAPSULE | Freq: Three times a day (TID) | ORAL | 0 refills | Status: DC
Start: 2022-06-29 — End: 2022-10-02

## 2022-06-29 MED ORDER — ONDANSETRON HCL 4 MG PO TABS: 4.0000 mg | ORAL_TABLET | Freq: Three times a day (TID) | ORAL | 2 refills | Status: DC | PRN

## 2022-06-29 MED ORDER — LISINOPRIL-HYDROCHLOROTHIAZIDE 20-25 MG PO TABS
1.0000 | ORAL_TABLET | Freq: Every day | ORAL | 1 refills | Status: DC
Start: 2022-06-29 — End: 2023-02-18

## 2022-06-29 MED ORDER — LISINOPRIL-HYDROCHLOROTHIAZIDE 10-12.5 MG PO TABS
2.0000 | ORAL_TABLET | Freq: Every day | ORAL | 0 refills | Status: DC
Start: 2022-06-29 — End: 2022-06-29

## 2022-06-29 MED ORDER — DICLOFENAC SODIUM 75 MG PO TBEC
75.0000 mg | DELAYED_RELEASE_TABLET | Freq: Two times a day (BID) | ORAL | 2 refills | Status: DC
Start: 2022-06-29 — End: 2022-10-02

## 2022-06-29 MED ORDER — DULOXETINE HCL 60 MG PO CPEP: 60.0000 mg | ORAL_CAPSULE | Freq: Every day | ORAL | 1 refills | Status: DC

## 2022-06-29 NOTE — Progress Notes (Signed)
Subjective:    Patient ID: Walter [name], male    DOB: 19-Oct-1965, 57 y.o.   MRN: [id number] Chief Complaint  Patient presents with   Neck Pain   Knee Pain   Abdominal Pain   PT presents to the office today for CPE and with neck pain with left arm tingling and numbness that started 6 months ago.   Complaining of right knee pain for years and has seen Ortho. Was told he needed to lose weight for knee replacement.   He is followed by [facility]   He has a OSA and does not use his machine regularly.   He has aortic atherosclerosis and takes Crestor 5 mg daily.  Neck Pain  This is a new problem. The current episode started more than 1 month ago. The problem occurs intermittently. The problem has been waxing and waning. The pain is present in the left side. The quality of the pain is described as aching and shooting. The pain is at a severity of 8/10. The pain is moderate. The symptoms are aggravated by twisting. He has tried bed rest and NSAIDs for the symptoms. The treatment provided mild relief.  Knee Pain  The incident occurred more than 1 week ago. There was no injury mechanism. The pain is present in the left knee and right knee. The quality of the pain is described as aching. The pain is at a severity of 10/10. The pain is moderate. He reports no foreign bodies present. The symptoms are aggravated by movement. He has tried acetaminophen, non-weight bearing and NSAIDs for the symptoms. The treatment provided mild relief.  Abdominal Pain This is a chronic problem. The current episode started more than 1 year ago. Quality: bloating. Associated symptoms include flatus, nausea and vomiting. Pertinent negatives include no belching, constipation or diarrhea. The treatment provided mild relief.  Hyperlipidemia This is a chronic problem. The current episode started more than 1 year ago. The problem is controlled. Recent lipid tests were reviewed and are normal. Exacerbating  diseases include obesity. Pertinent negatives include no shortness of breath. Current antihyperlipidemic treatment includes statins. The current treatment provides moderate improvement of lipids. Risk factors for coronary artery disease include dyslipidemia, hypertension and a sedentary lifestyle.  Hypertension This is a chronic problem. The current episode started more than 1 year ago. The problem has been waxing and waning since onset. The problem is uncontrolled. Associated symptoms include anxiety, malaise/fatigue and neck pain. Pertinent negatives include no peripheral edema or shortness of breath. Risk factors for coronary artery disease include dyslipidemia, obesity and male gender. The current treatment provides mild improvement.  Depression        This is a chronic problem.  The current episode started more than 1 year ago.   The onset quality is gradual.   Associated symptoms include fatigue, helplessness, hopelessness, restlessness and sad.  Past treatments include SNRIs - Serotonin and norepinephrine reuptake inhibitors.  Past medical history includes anxiety.   Anxiety Presents for follow-up visit. Symptoms include excessive worry, nausea, nervous/anxious behavior and restlessness. Patient reports no shortness of breath.        Review of Systems  Constitutional:  Positive for fatigue and malaise/fatigue.  Respiratory:  Negative for shortness of breath.   Gastrointestinal:  Positive for abdominal pain, flatus, nausea and vomiting. Negative for constipation and diarrhea.  Musculoskeletal:  Positive for neck pain.  Psychiatric/Behavioral:  Positive for depression. The patient is nervous/anxious.   All other systems reviewed and  are negative.  Family History  Problem Relation Age of Onset   Healthy Mother    Healthy Sister    Healthy Brother    Healthy Brother    Social History   Socioeconomic History   Marital status: Significant Other    Spouse name: Not on file   Number  of children: Not on file   Years of education: Not on file   Highest education level: Not on file  Occupational History   Not on file  Tobacco Use   Smoking status: Never   Smokeless tobacco: Never  Vaping Use   Vaping Use: Never used  Substance and Sexual Activity   Alcohol use: No    Comment:  " in the past"   Drug use: Yes    Types: Marijuana    Comment: smokes once a week per pt--02/23/2020   Sexual activity: Not on file  Other Topics Concern   Not on file  Social History Narrative   Not on file   Social Determinants of Health   Financial Resource Strain: Not on file  Food Insecurity: Not on file  Transportation Needs: Not on file  Physical Activity: Not on file  Stress: Not on file  Social Connections: Not on file       Objective:   Physical Exam Vitals reviewed.  Constitutional:      General: He is not in acute distress.    Appearance: He is well-developed. He is obese.  HENT:     Head: Normocephalic.     Right Ear: External ear normal.     Left Ear: External ear normal.  Eyes:     General:        Right eye: No discharge.        Left eye: No discharge.     Pupils: Pupils are equal, round, and reactive to light.  Neck:     Thyroid: No thyromegaly.  Cardiovascular:     Rate and Rhythm: Normal rate and regular rhythm.     Heart sounds: Normal heart sounds. No murmur heard. Pulmonary:     Effort: Pulmonary effort is normal. No respiratory distress.     Breath sounds: Normal breath sounds. No wheezing.  Abdominal:     General: Bowel sounds are normal. There is no distension.     Palpations: Abdomen is soft.     Tenderness: There is no abdominal tenderness.  Musculoskeletal:        General: No tenderness.     Cervical back: Normal range of motion and neck supple.     Right lower leg: Edema (trace) present.     Left lower leg: Edema (trace) present.     Comments: Pain in right knee with flexion and extension, full ROM of neck  Skin:    General: Skin is  warm and dry.     Findings: No erythema or rash.  Neurological:     Mental Status: He is alert and oriented to person, place, and time.     Cranial Nerves: No cranial nerve deficit.     Deep Tendon Reflexes: Reflexes are normal and symmetric.  Psychiatric:        Behavior: Behavior normal.        Thought Content: Thought content normal.        Judgment: Judgment normal.       BP (!) 153/73   Pulse 76   Temp (!) 97.5 F (36.4 C) (Temporal)   Ht 6' (1.829 m)   [place] [facility])  375 lb (170.1 kg)   SpO2 95%   BMI 50.86 kg/m      Assessment & Plan:  Walter [name] comes in today with chief complaint of Neck Pain, Knee Pain, and Abdominal Pain   Diagnosis and orders addressed:  1. Annual physical exam - CMP14+EGFR - CBC with Differential/Platelet - Lipid panel - PSA, total and free - TSH  2. Aortic atherosclerosis (HCC) - CMP14+EGFR - CBC with Differential/Platelet - rosuvastatin (CRESTOR) 5 MG tablet; Take 1 tablet (5 mg total) by mouth daily.  Dispense: 90 tablet; Refill: 0  3. Chronic bilateral low back pain with bilateral sciatica - CMP14+EGFR - CBC with Differential/Platelet  4. Essential hypertension - CMP14+EGFR - CBC with Differential/Platelet - lisinopril-hydrochlorothiazide (ZESTORETIC) 20-25 MG tablet; Take 1 tablet by mouth daily.  Dispense: 90 tablet; Refill: 1  5. Morbid obesity with BMI of 50.0-59.9, adult (HCC) - CMP14+EGFR - CBC with Differential/Platelet  6. OSA (obstructive sleep apnea) - CMP14+EGFR - CBC with Differential/Platelet  7. Nausea Referral to GI placed Zofran as needed - Ambulatory referral to Gastroenterology - CMP14+EGFR - CBC with Differential/Platelet - ondansetron (ZOFRAN) 4 MG tablet; Take 1 tablet (4 mg total) by mouth every 8 (eight) hours as needed for nausea or vomiting.  Dispense: 60 tablet; Refill: 2  8. Colon cancer screening - Ambulatory referral to Gastroenterology - CMP14+EGFR - CBC with  Differential/Platelet  9. Depression, major, single episode, moderate (HCC) - CMP14+EGFR - CBC with Differential/Platelet - Ambulatory referral to Psychiatry - DULoxetine (CYMBALTA) 60 MG capsule; Take 1 capsule (60 mg total) by mouth daily.  Dispense: 90 capsule; Refill: 1  10. GAD (generalized anxiety disorder) - CMP14+EGFR - CBC with Differential/Platelet - Ambulatory referral to Psychiatry - DULoxetine (CYMBALTA) 60 MG capsule; Take 1 capsule (60 mg total) by mouth daily.  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 Hyperlipidemia, unspecified hyperlipidemia type - rosuvastatin (CRESTOR) 5 MG tablet; Take 1 tablet (5 mg total) by mouth daily.  Dispense: 90 tablet; Refill: 0  13. Cervical radiculopathy Start diclofenac BID with food No other NSAID's  Gabapentin 300 mg TID prn  ROM exercises  - diclofenac (VOLTAREN) 75 MG EC tablet; Take 1 tablet (75 mg total) by mouth 2 (two) times daily.  Dispense: 60 tablet; Refill: 2 - predniSONE (STERAPRED UNI-PAK 21 TAB) 10 MG (21) TBPK tablet; Use as directed  Dispense: 21 tablet; Refill: 0 - gabapentin (NEURONTIN) 300 MG capsule; Take 1 capsule (300 mg total) by mouth 3 (three) times daily.  Dispense: 90 capsule; Refill: 0  14. Primary osteoarthritis of both knees Start diclofenac BID with food No other NSAID's  - diclofenac (VOLTAREN) 75 MG EC tablet; Take 1 tablet (75 mg total) by mouth 2 (two) times daily.  Dispense: 60 tablet; Refill: 2 - predniSONE (STERAPRED UNI-PAK 21 TAB) 10 MG (21) TBPK tablet; Use as directed  Dispense: 21 tablet; Refill: 0   Labs pending Health Maintenance reviewed Diet and exercise encouraged  Follow up plan: 1 month to recheck neck, knee   Jannifer Rodney, FNP

## 2022-06-29 NOTE — Patient Instructions (Signed)
Cervical Radiculopathy  Cervical radiculopathy happens when a nerve in the neck (a cervical nerve) is pinched or bruised. This condition can happen because of an injury to the cervical spine (vertebrae) in the neck, or as part of the normal aging process. Pressure on the cervical nerves can cause pain or numbness that travels from the neck all the way down to the arm and fingers. This condition usually gets better with rest. Treatment may be needed if the condition does not improve. What are the causes? This condition may be caused by: A neck injury. A bulging (herniated) disk. Muscle spasms. Muscle tightness in the neck due to overuse. Arthritis. Breakdown or degeneration in the bones and joints of the spine (spondylosis) due to aging. Bone spurs that may develop near the cervical nerves. What are the signs or symptoms? Symptoms of this condition include: Pain. The pain may travel from the neck to the arm and hand. The pain can be severe or irritating. It may get worse when you move your neck. Numbness or tingling in your arm or hand. Weakness in the affected arm and hand, in severe cases. How is this diagnosed? This condition may be diagnosed based on your symptoms, your medical history, and a physical exam. You may also have tests, including: X-rays. CT scan. MRI. Electromyogram (EMG). Nerve conduction tests. How is this treated? In many cases, treatment is not needed for this condition. With rest, the condition usually gets better over time. If treatment is needed, options may include: Wearing a soft neck collar (cervical collar) for short periods of time. Doing physical therapy to strengthen your neck muscles. Taking medicines. These may include NSAIDs, such as ibuprofen, or oral corticosteroids. Having spinal injections, in severe cases. Having surgery. This may be needed if other treatments do not help. Different types of surgery may be done depending on the cause of this  condition. Follow these instructions at home: If you have a cervical collar: Wear it as told by your health care provider. Remove it only as told by your health care provider. Ask your health care provider if you can remove the cervical collar for cleaning and bathing. If you are allowed to remove the collar for cleaning or bathing: Follow instructions from your health care provider about how to remove the collar safely. Clean the collar by wiping it with mild soap and water and drying it completely. Take out any removable pads in the collar every 1-2 days, and wash them by hand with soap and water. Let them air-dry completely before you put them back in the collar. Check your skin under the collar for irritation or sores. If you see any, tell your health care provider. Managing pain     Take over-the-counter and prescription medicines only as told by your health care provider. If directed, put ice on the affected area. To do this: If you have a soft neck collar, remove it as told by your health care provider. Put ice in a plastic bag. Place a towel between your skin and the bag. Leave the ice on for 20 minutes, 2-3 times a day. Remove the ice if your skin turns bright red. This is very important. If you cannot feel pain, heat, or cold, you have a greater risk of damage to the area. If applying ice does not help, you can try using heat. Use the heat source that your health care provider recommends, such as a moist heat pack or a heating pad. Place a towel between   your skin and the heat source. Leave the heat on for 20-30 minutes. Remove the heat if your skin turns bright red. This is especially important if you are unable to feel pain, heat, or cold. You have a greater risk of getting burned. Try a gentle neck and shoulder massage to help relieve symptoms. Activity Rest as needed. Return to your normal activities as told by your health care provider. Ask your health care provider what  activities are safe for you. Do stretching and strengthening exercises as told by your health care provider or your physical therapist. You may have to avoid lifting. Ask your health care provider how much you can safely lift. General instructions Use a flat pillow when you sleep. Do not drive while wearing a cervical collar. If you do not have a cervical collar, ask your health care provider if it is safe to drive while your neck heals. Ask your health care provider if the medicine prescribed to you requires you to avoid driving or using machinery. Do not use any products that contain nicotine or tobacco. These products include cigarettes, chewing tobacco, and vaping devices, such as e-cigarettes. If you need help quitting, ask your health care provider. Keep all follow-up visits. This is important. Contact a health care provider if: Your condition does not improve with treatment. Get help right away if: Your pain gets much worse and is not controlled with medicines. You have weakness or numbness in your hand, arm, face, or leg. You have a high fever. You have a stiff, rigid neck. You lose control of your bowels or your bladder (have incontinence). You have trouble with walking, balance, or speaking. Summary Cervical radiculopathy happens when a nerve in the neck is pinched or bruised. A nerve can get pinched from a bulging disk, arthritis, muscle spasms, or an injury to the neck. Symptoms include pain, tingling, or numbness radiating from the neck to the arm or hand. Weakness can also occur in severe cases. Treatment may include rest, wearing a cervical collar, and physical therapy. Medicines may be prescribed to help with pain. In severe cases, injections or surgery may be needed. This information is not intended to replace advice given to you by your health care provider. Make sure you discuss any questions you have with your health care provider. Document Revised: 07/14/2020 Document  Reviewed: 07/14/2020 Elsevier Patient Education  2024 [employer]  

## 2022-06-30 LAB — TSH: TSH: 1.63 u[IU]/mL (ref 0.450–4.500)

## 2022-06-30 LAB — CMP14+EGFR
ALT: 9 IU/L (ref 0–44)
AST: 15 IU/L (ref 0–40)
Albumin/Globulin Ratio: 1.6 (ref 1.2–2.2)
Albumin: 4.2 g/dL (ref 3.8–4.9)
Alkaline Phosphatase: 54 IU/L (ref 44–121)
BUN/Creatinine Ratio: 15 (ref 9–20)
BUN: 17 mg/dL (ref 6–24)
Bilirubin Total: 0.2 mg/dL (ref 0.0–1.2)
CO2: 28 mmol/L (ref 20–29)
Calcium: 9.7 mg/dL (ref 8.7–10.2)
Chloride: 92 mmol/L — ABNORMAL LOW (ref 96–106)
Creatinine, Ser: 1.12 mg/dL (ref 0.76–1.27)
Globulin, Total: 2.7 g/dL (ref 1.5–4.5)
Glucose: 103 mg/dL — ABNORMAL HIGH (ref 70–99)
Potassium: 3.9 mmol/L (ref 3.5–5.2)
Sodium: 135 mmol/L (ref 134–144)
Total Protein: 6.9 g/dL (ref 6.0–8.5)
eGFR: 78 mL/min/{1.73_m2} (ref >59)

## 2022-06-30 LAB — LIPID PANEL
Chol/HDL Ratio: 3.8 ratio (ref 0.0–5.0)
Cholesterol, Total: 145 mg/dL (ref 100–199)
HDL: 38 mg/dL — ABNORMAL LOW (ref >39)
LDL Chol Calc (NIH): 67 mg/dL (ref 0–99)
Triglycerides: 244 mg/dL — ABNORMAL HIGH (ref 0–149)
VLDL Cholesterol Cal: 40 mg/dL (ref 5–40)

## 2022-06-30 LAB — CBC WITH DIFFERENTIAL/PLATELET
Basophils Absolute: 0.1 10*3/uL (ref 0.0–0.2)
Basos: 1 %
EOS (ABSOLUTE): 0.4 10*3/uL (ref 0.0–0.4)
Eos: 5 %
Hematocrit: 36.2 % — ABNORMAL LOW (ref 37.5–51.0)
Hemoglobin: 12.3 g/dL — ABNORMAL LOW (ref 13.0–17.7)
Immature Grans (Abs): 0 10*3/uL (ref 0.0–0.1)
Immature Granulocytes: 0 %
Lymphocytes Absolute: 2.9 10*3/uL (ref 0.7–3.1)
Lymphs: 36 %
MCH: 30.6 pg (ref 26.6–33.0)
MCHC: 34 g/dL (ref 31.5–35.7)
MCV: 90 fL (ref 79–97)
Monocytes Absolute: 0.7 10*3/uL (ref 0.1–0.9)
Monocytes: 9 %
Neutrophils Absolute: 3.9 10*3/uL (ref 1.4–7.0)
Neutrophils: 49 %
Platelets: 184 10*3/uL (ref 150–450)
RBC: 4.02 x10E6/uL — ABNORMAL LOW (ref 4.14–5.80)
RDW: 12 % (ref 11.6–15.4)
WBC: 7.9 10*3/uL (ref 3.4–10.8)

## 2022-06-30 LAB — PSA, TOTAL AND FREE
PSA, Free Pct: 47.5 %
PSA, Free: 0.19 ng/mL
Prostate Specific Ag, Serum: 0.4 ng/mL (ref 0.0–4.0)

## 2022-07-02 ENCOUNTER — Telehealth: Payer: Self-pay | Admitting: Family

## 2022-07-03 ENCOUNTER — Encounter (INDEPENDENT_AMBULATORY_CARE_PROVIDER_SITE_OTHER): Payer: Self-pay | Admitting: *Deleted

## 2022-07-03 NOTE — Telephone Encounter (Signed)
ok 

## 2022-07-31 ENCOUNTER — Ambulatory Visit: Payer: Medicaid Other | Admitting: Family

## 2022-08-01 ENCOUNTER — Encounter: Payer: Self-pay | Admitting: Family

## 2022-08-21 ENCOUNTER — Telehealth: Payer: Self-pay

## 2022-08-21 NOTE — Transitions of Care (Post Inpatient/ED Visit) (Signed)
   08/21/2022  Name: LEEVON MADAY MRN: [id number] DOB: 1965-09-04  Today's TOC FU Call Status: Today's TOC FU Call Status:: Unsuccessul Call (1st Attempt) Unsuccessful Call (1st Attempt) Date: 08/21/22  Attempted to reach the patient regarding the most recent Inpatient/ED visit.  Follow Up Plan: Additional outreach attempts will be made to reach the patient to complete the Transitions of Care (Post Inpatient/ED visit) call.   Signature Karena Addison, LPN [facility] Nurse Health Advisor Direct Dial [phone]

## 2022-08-22 NOTE — Transitions of Care (Post Inpatient/ED Visit) (Signed)
   08/22/2022  Name: Walter [name] MRN: [id number] DOB: 1965/02/05  Today's TOC FU Call Status: Today's TOC FU Call Status:: Successful TOC FU Call Completed Unsuccessful Call (1st Attempt) Date: 08/21/22 [facility] FU Call Complete Date: 08/22/22  Transition Care Management Follow-up Telephone Call Date of Discharge: 08/20/22 Name of Other (Non-[facility]) Discharge Facility: [facility] Type of Discharge: Emergency Department Reason for ED Visit: Other: (cystitis) How have you been since you were released from the hospital?: Better Any questions or concerns?: No  Items Reviewed: Did you receive and understand the discharge instructions provided?: Yes Medications obtained,verified, and reconciled?: Yes (Medications Reviewed) Any new allergies since your discharge?: No Dietary orders reviewed?: Yes Do you have support at home?: Yes People in Home: spouse  Medications Reviewed Today: Medications Reviewed Today     Reviewed by Karena Addison, LPN (Licensed Practical Nurse) on 08/22/22 at 1229  Med List Status: <None>   Medication Order Taking? Sig Documenting Provider Last Dose Status Informant  cephALEXin (KEFLEX) 500 MG capsule [id number] Yes Take 500 mg by mouth 3 (three) times daily. [provider] Taking Active   diclofenac (VOLTAREN) 75 MG EC tablet [id number] Yes Take 1 tablet (75 mg total) by mouth 2 (two) times daily. Junie Spencer, FNP Taking Active   DULoxetine (CYMBALTA) 60 MG capsule [id number] Yes Take 1 capsule (60 mg total) by mouth daily. Junie Spencer, FNP Taking Active   gabapentin (NEURONTIN) 300 MG capsule [id number] Yes Take 1 capsule (300 mg total) by mouth 3 (three) times daily. Junie Spencer, FNP Taking Active   lisinopril-hydrochlorothiazide (ZESTORETIC) 20-25 MG tablet [id number] Yes Take 1 tablet by mouth daily. Junie Spencer, FNP Taking Active   ondansetron (ZOFRAN) 4 MG tablet [id number] No Take 1 tablet (4 mg total) by mouth every 8 (eight) hours  as needed for nausea or vomiting.  Patient not taking: Reported on 08/22/2022   Junie Spencer, FNP Not Taking Active   predniSONE (STERAPRED UNI-PAK 21 TAB) 10 MG (21) TBPK tablet [id number] No Use as directed  Patient not taking: Reported on 08/22/2022   Junie Spencer, FNP Not Taking Active   rosuvastatin (CRESTOR) 5 MG tablet [id number] Yes Take 1 tablet (5 mg total) by mouth daily. Junie Spencer, FNP Taking Active   Med List Note Geri [place]in[place], CPhT 08/02/16 1105): [facility] for methadone [phone]            Home Care and Equipment/Supplies: Were Home Health Services Ordered?: NA Any new equipment or medical supplies ordered?: NA  Functional Questionnaire: Do you need assistance with bathing/showering or dressing?: No Do you need assistance with meal preparation?: No Do you need assistance with eating?: No Do you have difficulty maintaining continence: No Do you need assistance with getting out of bed/getting out of a chair/moving?: No Do you have difficulty managing or taking your medications?: No  Follow up appointments reviewed: PCP Follow-up appointment confirmed?: No (no avail appts, sent message to staff to schedule) Specialist Hospital Follow-up appointment confirmed?: NA Do you need transportation to your follow-up appointment?: No Do you understand care options if your condition(s) worsen?: Yes-patient verbalized understanding    SIGNATURE Karena Addison, LPN [facility] Nurse Health Advisor Direct Dial [phone]

## 2022-10-02 ENCOUNTER — Ambulatory Visit (INDEPENDENT_AMBULATORY_CARE_PROVIDER_SITE_OTHER): Payer: MEDICAID

## 2022-10-02 ENCOUNTER — Encounter: Payer: Self-pay | Admitting: Family

## 2022-10-02 VITALS — BP 159/75 | HR 77 | Temp 98.3°F | Ht 72.0 in | Wt 369.0 lb

## 2022-10-02 DIAGNOSIS — K5900 Constipation, unspecified: Secondary | ICD-10-CM | POA: Diagnosis not present

## 2022-10-02 DIAGNOSIS — H9201 Otalgia, right ear: Secondary | ICD-10-CM

## 2022-10-02 DIAGNOSIS — G8929 Other chronic pain: Secondary | ICD-10-CM

## 2022-10-02 DIAGNOSIS — M170 Bilateral primary osteoarthritis of knee: Secondary | ICD-10-CM | POA: Diagnosis not present

## 2022-10-02 DIAGNOSIS — M25561 Pain in right knee: Secondary | ICD-10-CM | POA: Diagnosis not present

## 2022-10-02 DIAGNOSIS — S0300XD Dislocation of jaw, unspecified side, subsequent encounter: Secondary | ICD-10-CM

## 2022-10-02 DIAGNOSIS — Z6841 Body Mass Index (BMI) 40.0 and over, adult: Secondary | ICD-10-CM

## 2022-10-02 MED ORDER — LINACLOTIDE 72 MCG PO CAPS
72.0000 ug | ORAL_CAPSULE | Freq: Every day | ORAL | 2 refills | Status: DC
Start: 2022-10-02 — End: 2023-11-28

## 2022-10-02 MED ORDER — CELECOXIB 100 MG PO CAPS: 100.0000 mg | ORAL_CAPSULE | Freq: Two times a day (BID) | ORAL | 1 refills | Status: DC

## 2022-10-02 NOTE — Progress Notes (Signed)
Subjective:    Patient ID: Walter [name], male    DOB: 08/31/1965, 57 y.o.   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 He reports his pain is improved. He remembers several years ago he was diagnosed with TMJ and unsure if this was a flare up.   He is complaining of chronic right knee pain. Has seen Ortho in the past and was told he needed a knee replacement, but needed to lose weight prior. He has lost 60 lbs.  Knee Pain  The incident occurred more than 1 week ago. There was no injury mechanism. The pain is present in the right knee. The quality of the pain is described as aching. The pain is at a severity of 10/10. The pain is moderate. The pain has been Intermittent since onset. Associated symptoms include an inability to bear weight. Pertinent negatives include no loss of motion, loss of sensation, muscle weakness, numbness or tingling. He reports no foreign bodies present. The symptoms are aggravated by movement and weight bearing. He has tried rest for the symptoms. The treatment provided mild relief.  Constipation This is a chronic problem. The current episode started more than 1 year ago. His stool frequency is 2 to 3 times per week. The stool is described as formed. Risk factors include obesity. He has tried stool softeners for the symptoms. The treatment provided mild relief.      Review of Systems  Gastrointestinal:  Positive for constipation.  Neurological:  Negative for tingling and numbness.  All other systems reviewed and are negative.      Objective:   Physical Exam Vitals reviewed.  Constitutional:      General: He is not in acute distress.    Appearance: He is well-developed. He is obese.  HENT:     Head: Normocephalic.  Eyes:     General:        Right eye: No discharge.        Left  eye: No discharge.     Pupils: Pupils are equal, round, and reactive to light.  Neck:     Thyroid: No thyromegaly.  Cardiovascular:     Rate and Rhythm: Normal rate and regular rhythm.     Heart sounds: Normal heart sounds. No murmur heard. Pulmonary:     Effort: Pulmonary effort is normal. No respiratory distress.     Breath sounds: Normal breath sounds. No wheezing.  Abdominal:     General: Bowel sounds are normal. There is no distension.     Palpations: Abdomen is soft.     Tenderness: There is no abdominal tenderness.  Musculoskeletal:        General: Tenderness present.     Cervical back: Normal range of motion and neck supple.     Comments: Pain in right knee with flexion and extension  Skin:    General: Skin is warm and dry.     Findings: No erythema or rash.  Neurological:     Mental Status: He is alert and oriented to person, place, and time.     Cranial Nerves: No cranial nerve deficit.     Deep Tendon Reflexes: Reflexes are normal and symmetric.  Psychiatric:        Behavior: Behavior  normal.        Thought Content: Thought content normal.        Judgment: Judgment normal.     rightknee prepped with betadine Injected with Marcaine .5% plain and methylprednisolone with 22 guage needle x 1. Patient tolerated well.   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 Constipation, unspecified constipation type Start Linzess 72 mcg  Force fluids - linaclotide (LINZESS) 72 MCG capsule; Take 1 capsule (72 mcg total) by mouth daily before breakfast.  Dispense: 30 capsule; Refill: 2  2. Chronic pain of right knee Start Celebrex 100 mg BID  No other NSAID's  - celecoxib (CELEBREX) 100  MG capsule; Take 1 capsule (100 mg total) by mouth 2 (two) times daily.  Dispense: 60 capsule; Refill: 1  3. Primary osteoarthritis of both knees - celecoxib (CELEBREX) 100 MG capsule; Take 1 capsule (100 mg total) by mouth 2 (two) times daily.  Dispense: 60 capsule; Refill: 1  4. Right ear pain Stable at this time  5. Morbid obesity with BMI of 50.0-59.9, adult (HCC)  6. Dislocation of temporomandibular joint, subsequent encounter Start celebrex 100 mg BID  Avoid other NSAID's  - celecoxib (CELEBREX) 100 MG capsule; Take 1 capsule (100 mg total) by mouth 2 (two) times daily.  Dispense: 60 capsule; Refill: 1   Labs pending Health Maintenance reviewed Diet and exercise encouraged  Follow up plan: Keep chronic follow up  Jannifer Rodney, FNP

## 2022-10-02 NOTE — Patient Instructions (Signed)
Temporomandibular Joint Syndrome  Temporomandibular joint syndrome (TMJ syndrome) is a condition that causes pain in the temporomandibular joints. These joints are located near your ears and allow your jaw to open and close. For people with TMJ syndrome, chewing, biting, or other movements of the jaw can be difficult or painful. TMJ syndrome is often mild and goes away within a few weeks. However, sometimes the condition becomes a long-term (chronic) problem. What are the causes? This condition may be caused by: Grinding your teeth or clenching your jaw. Some people do this when they are stressed. Arthritis. An injury to the jaw. A head or neck injury. Teeth or dentures that are not aligned well. In some cases, the cause of TMJ syndrome may not be known. What are the signs or symptoms? The most common symptom of this condition is aching pain on the side of the head in the area of the TMJ. Other symptoms may include: Pain when moving your jaw, such as when chewing or biting. Not being able to open your jaw all the way. Making a clicking sound when you open your mouth. Headache. Earache. Neck or shoulder pain. How is this diagnosed? This condition may be diagnosed based on: Your symptoms and medical history. A physical exam. Your health care provider may check the range of motion of your jaw. Imaging tests, such as X-rays or an MRI. You may also need to see your dentist, who will check if your teeth and jaw are lined up correctly. How is this treated? TMJ syndrome often goes away on its own. If treatment is needed, it may include: Eating soft foods and applying ice or heat. Medicines to relieve pain or inflammation. Medicines or massage to relax the muscles. A splint, bite plate, or mouthpiece to prevent teeth grinding or jaw clenching. Relaxation techniques or counseling to help reduce stress. A therapy for pain in which an electrical current is applied to the nerves through the skin  (transcutaneous electrical nerve stimulation). Acupuncture. This may help to relieve pain. Jaw surgery. This is rarely needed. Follow these instructions at home:  Eating and drinking Eat a soft diet if you are having trouble chewing. Avoid foods that require a lot of chewing. Do not chew gum. General instructions Take over-the-counter and prescription medicines only as told by your health care provider. If directed, put ice on the painful area. To do this: Put ice in a plastic bag. Place a towel between your skin and the bag. Leave the ice on for 20 minutes, 2-3 times a day. Remove the ice if your skin turns bright red. This is very important. If you cannot feel pain, heat, or cold, you have a greater risk of damage to the area. Apply a warm, wet cloth (warm compress) to the painful area as told. Massage your jaw area and do any jaw stretching exercises as told by your health care provider. If you were given a splint, bite plate, or mouthpiece, wear it as told by your health care provider. Keep all follow-up visits. This is important. Where to find more information [employer]: [employer] Contact a health care provider if: You have trouble eating. You have new or worsening symptoms. Get help right away if: Your jaw locks. Summary Temporomandibular joint syndrome (TMJ syndrome) is a condition that causes pain in the temporomandibular joints. These joints are located near your ears and allow your jaw to open and close. TMJ syndrome is often mild and goes away within  a few weeks. However, sometimes the condition becomes a long-term (chronic) problem. Symptoms include an aching pain on the side of the head in the area of the TMJ, pain when chewing or biting, and being unable to open your jaw all the way. You may also make a clicking sound when you open your mouth. TMJ syndrome often goes away on its own. If treatment is needed, it may  include medicines to relieve pain, reduce inflammation, or relax the muscles. A splint, bite plate, or mouthpiece may also be used to prevent teeth grinding or jaw clenching. This information is not intended to replace advice given to you by your health care provider. Make sure you discuss any questions you have with your health care provider. Document Revised: 08/21/2020 Document Reviewed: 08/21/2020 Elsevier Patient Education  2024 [employer].

## 2023-01-02 ENCOUNTER — Encounter (INDEPENDENT_AMBULATORY_CARE_PROVIDER_SITE_OTHER): Payer: Self-pay | Admitting: *Deleted

## 2023-02-18 ENCOUNTER — Other Ambulatory Visit: Payer: Self-pay | Admitting: Family

## 2023-02-18 DIAGNOSIS — I10 Essential (primary) hypertension: Secondary | ICD-10-CM

## 2023-02-18 DIAGNOSIS — E785 Hyperlipidemia, unspecified: Secondary | ICD-10-CM

## 2023-02-18 DIAGNOSIS — I700 Atherosclerosis of aorta: Secondary | ICD-10-CM

## 2023-03-14 ENCOUNTER — Ambulatory Visit: Payer: MEDICAID | Admitting: Family

## 2023-03-14 ENCOUNTER — Telehealth: Payer: MEDICAID | Admitting: Family

## 2023-03-14 ENCOUNTER — Encounter: Payer: Self-pay | Admitting: Family

## 2023-03-15 ENCOUNTER — Ambulatory Visit: Payer: MEDICAID | Admitting: Family

## 2023-03-15 ENCOUNTER — Encounter: Payer: Self-pay | Admitting: Family

## 2023-03-15 VITALS — BP 124/76 | HR 59 | Temp 97.8°F | Ht 72.0 in | Wt 342.2 lb

## 2023-03-15 DIAGNOSIS — F1120 Opioid dependence, uncomplicated: Secondary | ICD-10-CM

## 2023-03-15 DIAGNOSIS — I700 Atherosclerosis of aorta: Secondary | ICD-10-CM

## 2023-03-15 DIAGNOSIS — R059 Cough, unspecified: Secondary | ICD-10-CM | POA: Diagnosis not present

## 2023-03-15 DIAGNOSIS — J209 Acute bronchitis, unspecified: Secondary | ICD-10-CM

## 2023-03-15 DIAGNOSIS — E782 Mixed hyperlipidemia: Secondary | ICD-10-CM

## 2023-03-15 DIAGNOSIS — G4733 Obstructive sleep apnea (adult) (pediatric): Secondary | ICD-10-CM

## 2023-03-15 DIAGNOSIS — R7303 Prediabetes: Secondary | ICD-10-CM

## 2023-03-15 DIAGNOSIS — F411 Generalized anxiety disorder: Secondary | ICD-10-CM

## 2023-03-15 DIAGNOSIS — K429 Umbilical hernia without obstruction or gangrene: Secondary | ICD-10-CM

## 2023-03-15 DIAGNOSIS — F321 Major depressive disorder, single episode, moderate: Secondary | ICD-10-CM | POA: Diagnosis not present

## 2023-03-15 DIAGNOSIS — I10 Essential (primary) hypertension: Secondary | ICD-10-CM

## 2023-03-15 DIAGNOSIS — M170 Bilateral primary osteoarthritis of knee: Secondary | ICD-10-CM

## 2023-03-15 LAB — VERITOR FLU A/B WAIVED
Influenza A: NEGATIVE
Influenza B: NEGATIVE

## 2023-03-15 LAB — BAYER DCA HB A1C WAIVED: HB A1C (BAYER DCA - WAIVED): 5.3 % (ref 4.8–5.6)

## 2023-03-15 MED ORDER — ALBUTEROL SULFATE HFA 108 (90 BASE) MCG/ACT IN AERS: 2.0000 | INHALATION_SPRAY | Freq: Four times a day (QID) | RESPIRATORY_TRACT | 0 refills | Status: AC | PRN

## 2023-03-15 MED ORDER — PREDNISONE 10 MG (21) PO TBPK: ORAL_TABLET | ORAL | 0 refills | Status: DC

## 2023-03-15 MED ORDER — LISINOPRIL-HYDROCHLOROTHIAZIDE 20-25 MG PO TABS: 1.0000 | ORAL_TABLET | Freq: Every day | ORAL | 2 refills | Status: AC

## 2023-03-15 MED ORDER — ROSUVASTATIN CALCIUM 5 MG PO TABS: 5.0000 mg | ORAL_TABLET | Freq: Every day | ORAL | 2 refills | Status: AC

## 2023-03-15 NOTE — Progress Notes (Signed)
Subjective:    Patient ID: Walter [name], male    DOB: 1965-03-17, 58 y.o.   MRN: [id number]  Chief Complaint  Patient presents with   Hernia    Pt states his hernia site is hurting but he is also sick running a fever for a few days now. Chills, SOB, body aches, headache, fever, cough, wheezing, ears popping.   PT presents to the office today with a complaints of hernia.   He also reports he has flu like symptoms that started three days ago.   Complaining of right knee pain for years and has seen Ortho. Was told he needed to lose weight for knee replacement.    He is followed by [facility]    He has a OSA and does not use his machine regularly.    He has aortic atherosclerosis and takes Crestor 5 mg daily.   Has an umbilical hernia and needs referral.   He reports he has lost 100 lbs.      03/15/2023    1:28 PM 10/02/2022    9:30 AM 06/29/2022   11:24 AM  Last 3 Weights  Weight (lbs) 342 lb 3.2 oz 369 lb 375 lb  Weight (kg) 155.221 kg 167.377 kg 170.099 kg     Hypertension This is a chronic problem. The current episode started more than 1 year ago. The problem has been resolved since onset. Associated symptoms include anxiety, headaches, malaise/fatigue and shortness of breath. Pertinent negatives include no peripheral edema. Risk factors for coronary artery disease include obesity, male gender and sedentary lifestyle. The current treatment provides moderate improvement.  Arthritis Presents for follow-up visit. He complains of pain and stiffness. Affected locations include the left knee and right knee. His pain is at a severity of 7/10.  Diabetes He presents for his follow-up diabetic visit. Diabetes type: prediabetes. Hypoglycemia symptoms include headaches and nervousness/anxiousness.  Hyperlipidemia This is a chronic problem. The current episode started more than 1 year ago. Exacerbating diseases include obesity. Associated symptoms include shortness of  breath. Current antihyperlipidemic treatment includes statins. The current treatment provides moderate improvement of lipids. Risk factors for coronary artery disease include dyslipidemia, hypertension, male sex, obesity and a sedentary lifestyle.  Anxiety Presents for follow-up visit. Symptoms include depressed mood, excessive worry, nervous/anxious behavior and shortness of breath. Symptoms occur occasionally. The severity of symptoms is mild.    Depression        This is a chronic problem.  The current episode started more than 1 year ago.   The onset quality is gradual.   The problem occurs intermittently.  Associated symptoms include headaches.  Associated symptoms include no helplessness, no hopelessness and not sad.  Past treatments include nothing.  Past medical history includes anxiety.   URI  This is a new problem. The current episode started in the past 7 days. The problem has been gradually improving. There has been no fever. Associated symptoms include congestion, coughing, headaches and joint pain. Pertinent negatives include no ear pain, sinus pain or sore throat. He has tried decongestant for the symptoms. The treatment provided mild relief.      Review of Systems  Constitutional:  Positive for malaise/fatigue.  HENT:  Positive for congestion. Negative for ear pain, sinus pain and sore throat.   Respiratory:  Positive for cough and shortness of breath.   Musculoskeletal:  Positive for arthritis, joint pain and stiffness.  Neurological:  Positive for headaches.  Psychiatric/Behavioral:  Positive for depression.  The patient is nervous/anxious.   All other systems reviewed and are negative.   Social History   Socioeconomic History   Marital status: Significant Other    Spouse name: Not on file   Number of children: Not on file   Years of education: Not on file   Highest education level: Not on file  Occupational History   Not on file  Tobacco Use   Smoking status: Never    Smokeless tobacco: Never  Vaping Use   Vaping status: Never Used  Substance and Sexual Activity   Alcohol use: No    Comment:  " in the past"   Drug use: Yes    Types: Marijuana    Comment: smokes once a week per pt--02/23/2020   Sexual activity: Not on file  Other Topics Concern   Not on file  Social History Narrative   Not on file   Social Drivers of Health   Financial Resource Strain: Not on file  Food Insecurity: Not on file  Transportation Needs: Not on file  Physical Activity: Not on file  Stress: Not on file  Social Connections: Unknown (05/25/2021)   Received from [facility]   Social Network    Social Network: Not on file   Family History  Problem Relation Age of Onset   Healthy Mother    Healthy Sister    Healthy Brother    Healthy Brother         Objective:   Physical Exam Vitals reviewed.  Constitutional:      General: He is not in acute distress.    Appearance: He is well-developed. He is obese.  HENT:     Head: Normocephalic.     Right Ear: Tympanic membrane and external ear normal.     Left Ear: Tympanic membrane and external ear normal.  Eyes:     General:        Right eye: No discharge.        Left eye: No discharge.     Pupils: Pupils are equal, round, and reactive to light.  Neck:     Thyroid: No thyromegaly.  Cardiovascular:     Rate and Rhythm: Normal rate and regular rhythm.     Heart sounds: Normal heart sounds. No murmur heard. Pulmonary:     Effort: Pulmonary effort is normal. No respiratory distress.     Breath sounds: Normal breath sounds. No wheezing.  Abdominal:     General: Bowel sounds are normal. There is no distension.     Palpations: Abdomen is soft.     Tenderness: There is no abdominal tenderness.     Hernia: A hernia is present. Hernia is present in the umbilical area.  Musculoskeletal:        General: No tenderness. Normal range of motion.     Cervical back: Normal range of motion and neck supple.   Skin:    General: Skin is warm and dry.     Findings: No erythema or rash.  Neurological:     Mental Status: He is alert and oriented to person, place, and time.     Cranial Nerves: No cranial nerve deficit.     Deep Tendon Reflexes: Reflexes are normal and symmetric.  Psychiatric:        Behavior: Behavior normal.        Thought Content: Thought content normal.        Judgment: Judgment normal.       BP 124/76  Pulse (!) 59   Temp 97.8 F (36.6 C)   Ht 6' (1.829 m)   Wt (!) 342 lb 3.2 oz (155.2 kg)   SpO2 95%   BMI 46.41 kg/m      Assessment & Plan:  LANKFORD GUTZMER comes in today with chief complaint of Hernia (Pt states his hernia site is hurting but he is also sick running a fever for a few days now. Chills, SOB, body aches, headache, fever, cough, wheezing, ears popping.)   Diagnosis and orders addressed:  1. Cough, unspecified type (Primary) - Veritor Flu A/B Waived - Novel Coronavirus, NAA ([facility]) - CMP14+EGFR - CBC with Differential/Platelet  2. Umbilical hernia without obstruction and without gangrene - CMP14+EGFR - CBC with Differential/Platelet - Ambulatory referral to General Surgery  3. Aortic atherosclerosis (HCC) - CMP14+EGFR - CBC with Differential/Platelet - rosuvastatin (CRESTOR) 5 MG tablet; Take 1 tablet (5 mg total) by mouth daily. **NEEDS TO BE SEEN BEFORE NEXT REFILL**  Dispense: 90 tablet; Refill: 2  4. Depression, major, single episode, moderate (HCC) - CMP14+EGFR - CBC with Differential/Platelet  5. Essential hypertension - CMP14+EGFR - CBC with Differential/Platelet - lisinopril-hydrochlorothiazide (ZESTORETIC) 20-25 MG tablet; Take 1 tablet by mouth daily. **NEEDS TO BE SEEN BEFORE NEXT REFILL**  Dispense: 90 tablet; Refill: 2  6. GAD (generalized anxiety disorder) - CMP14+EGFR - CBC with Differential/Platelet  7. Mixed hyperlipidemia - CMP14+EGFR - CBC with Differential/Platelet - rosuvastatin (CRESTOR) 5 MG tablet;  Take 1 tablet (5 mg total) by mouth daily. **NEEDS TO BE SEEN BEFORE NEXT REFILL**  Dispense: 90 tablet; Refill: 2  8. Methadone dependence (HCC) - CMP14+EGFR - CBC with Differential/Platelet  9. Morbid obesity with BMI of 45.0-49.9, adult (HCC) - CMP14+EGFR - CBC with Differential/Platelet  10. OSA (obstructive sleep apnea) - CMP14+EGFR - CBC with Differential/Platelet  11. Prediabetes - Bayer DCA Hb A1c Waived - CMP14+EGFR - CBC with Differential/Platelet  12. Primary osteoarthritis of both knees - CMP14+EGFR - CBC with Differential/Platelet  13. Acute bronchitis, unspecified organism - Take meds as prescribed - Use a cool mist humidifier  -Use saline nose sprays frequently -Force fluids -For any cough or congestion  Use plain Mucinex- regular strength or max strength is fine -For fever or aces or pains- take tylenol or ibuprofen. -Throat lozenges if help -Follow up if symptoms worsen or do not improve  - predniSONE (STERAPRED UNI-PAK 21 TAB) 10 MG (21) TBPK tablet; Use as directed  Dispense: 21 tablet; Refill: 0 - albuterol (VENTOLIN HFA) 108 (90 Base) MCG/ACT inhaler; Inhale 2 puffs into the lungs every 6 (six) hours as needed for wheezing or shortness of breath.  Dispense: 8 g; Refill: 0   Labs pending Continue current medications  Keep follow up with specialists  Health Maintenance reviewed Diet and exercise encouraged  No follow-ups on file.    Jannifer Rodney, FNP

## 2023-03-15 NOTE — Patient Instructions (Signed)

## 2023-03-16 LAB — CMP14+EGFR
ALT: 12 [IU]/L (ref 0–44)
AST: 19 [IU]/L (ref 0–40)
Albumin: 4 g/dL (ref 3.8–4.9)
Alkaline Phosphatase: 64 [IU]/L (ref 44–121)
BUN/Creatinine Ratio: 17 (ref 9–20)
BUN: 20 mg/dL (ref 6–24)
Bilirubin Total: <0.2 mg/dL (ref 0.0–1.2)
CO2: 27 mmol/L (ref 20–29)
Calcium: 8.8 mg/dL (ref 8.7–10.2)
Chloride: 94 mmol/L — ABNORMAL LOW (ref 96–106)
Creatinine, Ser: 1.17 mg/dL (ref 0.76–1.27)
Globulin, Total: 2.6 g/dL (ref 1.5–4.5)
Glucose: 100 mg/dL — ABNORMAL HIGH (ref 70–99)
Potassium: 4.2 mmol/L (ref 3.5–5.2)
Sodium: 136 mmol/L (ref 134–144)
Total Protein: 6.6 g/dL (ref 6.0–8.5)
eGFR: 74 mL/min/{1.73_m2} (ref >59)

## 2023-03-16 LAB — CBC WITH DIFFERENTIAL/PLATELET
Basophils Absolute: 0 10*3/uL (ref 0.0–0.2)
Basos: 0 %
EOS (ABSOLUTE): 0 10*3/uL (ref 0.0–0.4)
Eos: 1 %
Hematocrit: 38.1 % (ref 37.5–51.0)
Hemoglobin: 12.9 g/dL — ABNORMAL LOW (ref 13.0–17.7)
Immature Grans (Abs): 0 10*3/uL (ref 0.0–0.1)
Immature Granulocytes: 0 %
Lymphocytes Absolute: 1.3 10*3/uL (ref 0.7–3.1)
Lymphs: 42 %
MCH: 30.6 pg (ref 26.6–33.0)
MCHC: 33.9 g/dL (ref 31.5–35.7)
MCV: 91 fL (ref 79–97)
Monocytes Absolute: 0.5 10*3/uL (ref 0.1–0.9)
Monocytes: 16 %
Neutrophils Absolute: 1.3 10*3/uL — ABNORMAL LOW (ref 1.4–7.0)
Neutrophils: 41 %
Platelets: 175 10*3/uL (ref 150–450)
RBC: 4.21 x10E6/uL (ref 4.14–5.80)
RDW: 11.9 % (ref 11.6–15.4)
WBC: 3.1 10*3/uL — ABNORMAL LOW (ref 3.4–10.8)

## 2023-03-16 LAB — NOVEL CORONAVIRUS, NAA: SARS-CoV-2, NAA: NOT DETECTED

## 2023-03-18 ENCOUNTER — Emergency Department (HOSPITAL_COMMUNITY): Payer: MEDICAID

## 2023-03-18 ENCOUNTER — Encounter (HOSPITAL_COMMUNITY): Payer: Self-pay | Admitting: Emergency Medicine

## 2023-03-18 ENCOUNTER — Other Ambulatory Visit: Payer: Self-pay

## 2023-03-18 ENCOUNTER — Emergency Department (HOSPITAL_COMMUNITY)
Admission: EM | Admit: 2023-03-18 | Discharge: 2023-03-19 | Discharge status: Against Medical Advice | Payer: MEDICAID | Attending: Emergency Medicine | Admitting: Emergency Medicine

## 2023-03-18 DIAGNOSIS — R0981 Nasal congestion: Secondary | ICD-10-CM | POA: Insufficient documentation

## 2023-03-18 DIAGNOSIS — R059 Cough, unspecified: Secondary | ICD-10-CM | POA: Diagnosis present

## 2023-03-18 DIAGNOSIS — Z5321 Procedure and treatment not carried out due to patient leaving prior to being seen by health care provider: Secondary | ICD-10-CM | POA: Insufficient documentation

## 2023-03-18 MED ORDER — IPRATROPIUM-ALBUTEROL 0.5-2.5 (3) MG/3ML IN SOLN
3.0000 mL | Freq: Once | RESPIRATORY_TRACT | Status: AC
  Administered 2023-03-18: 3 mL via RESPIRATORY_TRACT
  Filled 2023-03-18: qty 3

## 2023-03-18 NOTE — ED Triage Notes (Signed)
 Pt presents with cough and feeling like "concrete in his lungs"  Pt reports being on prednisone since Friday.  Has been coughing, barely productive.  Feels he is "suffocating"  Pt ambulatory to triage.  Congested.  States he used his son's neb treatment yesterday which helped  Has not been able to rest.

## 2023-03-19 MED ORDER — ALBUTEROL SULFATE (2.5 MG/3ML) 0.083% IN NEBU: 2.5000 mg | INHALATION_SOLUTION | Freq: Four times a day (QID) | RESPIRATORY_TRACT | 1 refills | Status: AC | PRN

## 2023-03-19 NOTE — ED Notes (Signed)
Per registration pt left the facility 

## 2023-06-14 ENCOUNTER — Ambulatory Visit: Payer: Self-pay

## 2023-06-14 ENCOUNTER — Ambulatory Visit: Payer: Self-pay | Admitting: *Deleted

## 2023-06-14 NOTE — Telephone Encounter (Signed)
  Chief Complaint: Urinary frequency and lower back pain per his note.    He was just triaged by another triage nurse so no need to triage him.  He was instructed to go to the urgent which he is going to do.   No triage needed. Symptoms:  Frequency:  Pertinent Negatives: Patient denies  Disposition: [] ED /[] Urgent Care (no appt availability in office) / [] Appointment(In office/virtual)/ []  [facility]/ [] Home Care/ [] Refused Recommended Disposition /[] [facility]/ []  Follow-up with PCP Additional Notes: Already triaged.   Going to the urgent care.

## 2023-06-14 NOTE — Telephone Encounter (Signed)
 Copied from CRM [id number]. Topic: Clinical - Pink Word Triage >> Jun 14, 2023  2:37 PM Everette C wrote: Reason for Triage: The patient has experienced frequent urination and lower back discomfort for the past three days roughly   The patient would like to speak with a member of clinical staff about their discomfort and possible prescriptions to help with their concerns   Please contact when possible >> Jun 14, 2023  3:04 PM DeAngela L wrote: Patient number  [phone] (M)  Chief Complaint: frequency with urination, flank and back pain Symptoms: see above Frequency: constant; started yesterday; hx kidney infection Pertinent Negatives: Patient denies fever, blood in urine Disposition: [] ED /[x] Urgent Care (no appt availability in office) / [] Appointment(In office/virtual)/ []  [facility]/ [] Home Care/ [] Refused Recommended Disposition /[]  Mobile Bus/ []  Follow-up with PCP Additional Notes: no appointments available; instructed to go to the ER; care advice given, denies questions; instructed to go to ER if becomes worse.    Reason for Disposition  Side (flank) or lower back pain present  Answer Assessment - Initial Assessment Questions 1. SYMPTOM: "What's the main symptom you're concerned about?" (e.g., frequency, incontinence)     Frequency, lower back, flank pain 2. ONSET: "When did the  pain  start?"     yesterday 3. PAIN: "Is there any pain?" If Yes, ask: "How bad is it?" (Scale: 1-10; mild, moderate, severe)     Comes and goes mild states dull ache 4. CAUSE: "What do you think is causing the symptoms?"     uti 5. OTHER SYMPTOMS: "Do you have any other symptoms?" (e.g., blood in urine, fever, flank pain, pain with urination)     Frequency, flank pain, low back pain 6. PREGNANCY: "Is there any chance you are pregnant?" "When was your last menstrual period?"     na  Protocols used: Urinary Symptoms-A-AH

## 2023-06-14 NOTE — Telephone Encounter (Signed)
 Message from [place] C sent at 06/14/2023  2:53 PM EDT  Summary: urinary discomfort / rx req   Copied From CRM [id number]. Reason for Triage: The patient has experienced frequent urination and lower back discomfort for the past three days roughly  The patient would like to speak with a member of clinical staff about their discomfort and possible prescriptions to help with their concerns  Please contact when possible          Call History  Contact Date/Time Type Contact Phone/Fax By  06/14/2023 02:35 PM EDT Phone (Incoming) Lucrecia Sables, Everette A   Reason for Disposition  All other males with painful urination    Pt triaged by another triage nurse and instructed to go to the urgent care which he is agreeable to doing.  Answer Assessment - Initial Assessment Questions 1. SEVERITY: "How bad is the pain?"  (e.g., Scale 1-10; mild, moderate, or severe)   - MILD (1-3): Complains slightly about urination hurting.   - MODERATE (4-7): Interferes with normal activities.     - SEVERE (8-10): Excruciating, unwilling or unable to urinate because of the pain.      I returned his call.   "I just got off the phone with another nurse from the office".   "My son called so I had to hang up but she had told me I needed to go to the urgent care".   "That's what I'm going to do".     I made sure he didn't have any other questions or concerns, he did not.   He thanked me for calling though     "I'm good".   "Thank you".   "Going to urgent care".    2. FREQUENCY: "How many times have you had painful urination today?"       3. PATTERN: "Is pain present every time you urinate or just sometimes?"       4. ONSET: "When did the painful urination start?"       5. FEVER: "Do you have a fever?" If Yes, ask: "What is your temperature, how was it measured, and when did it start?"      6. PAST UTI: "Have you had a urine infection before?" If Yes, ask: "When was the last time?" and "What happened that  time?"       7. CAUSE: "What do you think is causing the painful urination?"       8. OTHER SYMPTOMS: "Do you have any other symptoms?" (e.g., flank pain, penis discharge, scrotal pain, blood in urine)  Protocols used: Urination Pain - Male-A-AH

## 2023-06-25 ENCOUNTER — Ambulatory Visit (INDEPENDENT_AMBULATORY_CARE_PROVIDER_SITE_OTHER): Payer: MEDICAID | Admitting: Family

## 2023-06-25 ENCOUNTER — Encounter: Payer: Self-pay | Admitting: Family

## 2023-06-25 VITALS — BP 125/79 | HR 58 | Temp 98.1°F | Ht 72.0 in | Wt 252.8 lb

## 2023-06-25 DIAGNOSIS — R350 Frequency of micturition: Secondary | ICD-10-CM | POA: Diagnosis not present

## 2023-06-25 DIAGNOSIS — Z1211 Encounter for screening for malignant neoplasm of colon: Secondary | ICD-10-CM

## 2023-06-25 DIAGNOSIS — M545 Low back pain, unspecified: Secondary | ICD-10-CM | POA: Diagnosis not present

## 2023-06-25 DIAGNOSIS — R109 Unspecified abdominal pain: Secondary | ICD-10-CM

## 2023-06-25 LAB — URINALYSIS, COMPLETE
Bilirubin, UA: NEGATIVE
Glucose, UA: NEGATIVE
Ketones, UA: NEGATIVE
Leukocytes,UA: NEGATIVE
Nitrite, UA: NEGATIVE
Protein,UA: NEGATIVE
RBC, UA: NEGATIVE
Specific Gravity, UA: 1.025 (ref 1.005–1.030)
Urobilinogen, Ur: 0.2 mg/dL (ref 0.2–1.0)
pH, UA: 5.5 (ref 5.0–7.5)

## 2023-06-25 LAB — MICROSCOPIC EXAMINATION
Bacteria, UA: NONE SEEN
Epithelial Cells (non renal): NONE SEEN /HPF (ref 0–10)
RBC, Urine: NONE SEEN /HPF (ref 0–2)
Renal Epithel, UA: NONE SEEN /HPF
WBC, UA: NONE SEEN /HPF (ref 0–5)
Yeast, UA: NONE SEEN

## 2023-06-25 MED ORDER — CELECOXIB 100 MG PO CAPS: 100.0000 mg | ORAL_CAPSULE | Freq: Two times a day (BID) | ORAL | 1 refills | Status: DC

## 2023-06-25 MED ORDER — BACLOFEN 10 MG PO TABS
10.0000 mg | ORAL_TABLET | Freq: Three times a day (TID) | ORAL | 0 refills | Status: DC
Start: 2023-06-25 — End: 2023-11-28

## 2023-06-25 NOTE — Patient Instructions (Signed)
 Acute Back Pain, Adult Acute back pain is sudden and usually short-lived. It is often caused by an injury to the muscles and tissues in the back. The injury may result from: A muscle, tendon, or ligament getting overstretched or torn. Ligaments are tissues that connect bones to each other. Lifting something improperly can cause a back strain. Wear and tear (degeneration) of the spinal disks. Spinal disks are circular tissue that provide cushioning between the bones of the spine (vertebrae). Twisting motions, such as while playing sports or doing yard work. A hit to the back. Arthritis. You may have a physical exam, lab tests, and imaging tests to find the cause of your pain. Acute back pain usually goes away with rest and home care. Follow these instructions at home: Managing pain, stiffness, and swelling Take over-the-counter and prescription medicines only as told by your health care provider. Treatment may include medicines for pain and inflammation that are taken by mouth or applied to the skin, or muscle relaxants. Your health care provider may recommend applying ice during the first 24-48 hours after your pain starts. To do this: Put ice in a plastic bag. Place a towel between your skin and the bag. Leave the ice on for 20 minutes, 2-3 times a day. Remove the ice if your skin turns bright red. This is very important. If you cannot feel pain, heat, or cold, you have a greater risk of damage to the area. If directed, apply heat to the affected area as often as told by your health care provider. Use the heat source that your health care provider recommends, such as a moist heat pack or a heating pad. Place a towel between your skin and the heat source. Leave the heat on for 20-30 minutes. Remove the heat if your skin turns bright red. This is especially important if you are unable to feel pain, heat, or cold. You have a greater risk of getting burned. Activity  Do not stay in bed. Staying in  bed for more than 1-2 days can delay your recovery. Sit up and stand up straight. Avoid leaning forward when you sit or hunching over when you stand. If you work at a desk, sit close to it so you do not need to lean over. Keep your chin tucked in. Keep your neck drawn back, and keep your elbows bent at a 90-degree angle (right angle). Sit high and close to the steering wheel when you drive. Add lower back (lumbar) support to your car seat, if needed. Take short walks on even surfaces as soon as you are able. Try to increase the length of time you walk each day. Do not sit, drive, or stand in one place for more than 30 minutes at a time. Sitting or standing for long periods of time can put stress on your back. Do not drive or use heavy machinery while taking prescription pain medicine. Use proper lifting techniques. When you bend and lift, use positions that put less stress on your back: [place] your knees. Keep the load close to your body. Avoid twisting. Exercise regularly as told by your health care provider. Exercising helps your back heal faster and helps prevent back injuries by keeping muscles strong and flexible. Work with a physical therapist to make a safe exercise program, as recommended by your health care provider. Do any exercises as told by your physical therapist. Lifestyle Maintain a healthy weight. Extra weight puts stress on your back and makes it difficult to have good  posture. Avoid activities or situations that make you feel anxious or stressed. Stress and anxiety increase muscle tension and can make back pain worse. Learn ways to manage anxiety and stress, such as through exercise. General instructions Sleep on a firm mattress in a comfortable position. Try lying on your side with your knees slightly bent. If you lie on your back, put a pillow under your knees. Keep your head and neck in a straight line with your spine (neutral position) when using electronic equipment like  smartphones or pads. To do this: Raise your smartphone or pad to look at it instead of bending your head or neck to look down. Put the smartphone or pad at the level of your face while looking at the screen. Follow your treatment plan as told by your health care provider. This may include: Cognitive or behavioral therapy. Acupuncture or massage therapy. Meditation or yoga. Contact a health care provider if: You have pain that is not relieved with rest or medicine. You have increasing pain going down into your legs or buttocks. Your pain does not improve after 2 weeks. You have pain at night. You lose weight without trying. You have a fever or chills. You develop nausea or vomiting. You develop abdominal pain. Get help right away if: You develop new bowel or bladder control problems. You have unusual weakness or numbness in your arms or legs. You feel faint. These symptoms may represent a serious problem that is an emergency. Do not wait to see if the symptoms will go away. Get medical help right away. Call your local emergency services (911 in the [place] Do not drive yourself to the hospital. Summary Acute back pain is sudden and usually short-lived. Use proper lifting techniques. When you bend and lift, use positions that put less stress on your back. Take over-the-counter and prescription medicines only as told by your health care provider, and apply heat or ice as told. This information is not intended to replace advice given to you by your health care provider. Make sure you discuss any questions you have with your health care provider. Document Revised: 04/01/2020 Document Reviewed: 04/01/2020 Elsevier Patient Education  2024 [employer].

## 2023-06-25 NOTE — Progress Notes (Signed)
 Subjective:    Patient ID: Walter [name], male    DOB: 01-14-1966, 58 y.o.   MRN: [id number]  Chief Complaint  Patient presents with   Back Pain    With tenderness in th groin. Patient c/o with urine frequency and and right flank pain. Off and on for 3 weeks.   Pt presents to the office today with urinary frequency that started three weeks ago.  Back Pain  Urinary Frequency  This is a new problem. The current episode started 1 to 4 weeks ago. The problem occurs intermittently. The problem has been waxing and waning. The pain is at a severity of 0/10. The patient is experiencing no pain. Associated symptoms include flank pain, frequency, hesitancy and urgency. Pertinent negatives include no hematuria, nausea or vomiting. He has tried increased fluids for the symptoms. The treatment provided mild relief.  Flank Pain This is a new problem. The current episode started 1 to 4 weeks ago. The problem occurs intermittently. Pain location: right flank pain. The quality of the pain is described as aching. The pain is at a severity of 5/10. The pain is moderate. The symptoms are aggravated by twisting. Risk factors include obesity. He has tried bed rest for the symptoms. The treatment provided mild relief.      Review of Systems  Gastrointestinal:  Negative for nausea and vomiting.  Genitourinary:  Positive for flank pain, frequency, hesitancy and urgency. Negative for hematuria.  Musculoskeletal:  Positive for back pain.  All other systems reviewed and are negative.   Social History   Socioeconomic History   Marital status: Significant Other    Spouse name: Not on file   Number of children: Not on file   Years of education: Not on file   Highest education level: Not on file  Occupational History   Not on file  Tobacco Use   Smoking status: Never   Smokeless tobacco: Never  Vaping Use   Vaping status: Never Used  Substance and Sexual Activity   Alcohol use: No    Comment:  " in  the past"   Drug use: Yes    Types: Marijuana    Comment: smokes once a week per pt--02/23/2020   Sexual activity: Not on file  Other Topics Concern   Not on file  Social History Narrative   Not on file   Social Drivers of Health   Financial Resource Strain: Not on file  Food Insecurity: Not on file  Transportation Needs: Not on file  Physical Activity: Not on file  Stress: Not on file  Social Connections: Unknown (05/25/2021)   Received from [facility], [facility]   Social Network    Social Network: Not on file   Family History  Problem Relation Age of Onset   Healthy Mother    Healthy Sister    Healthy Brother    Healthy Brother         Objective:   Physical Exam Vitals reviewed.  Constitutional:      General: He is not in acute distress.    Appearance: He is well-developed.  HENT:     Head: Normocephalic.     Right Ear: External ear normal.     Left Ear: External ear normal.  Eyes:     General:        Right eye: No discharge.        Left eye: No discharge.     Pupils: Pupils are equal, round, and reactive to light.  Neck:     Thyroid: No thyromegaly.  Cardiovascular:     Rate and Rhythm: Normal rate and regular rhythm.     Heart sounds: Normal heart sounds. No murmur heard. Pulmonary:     Effort: Pulmonary effort is normal. No respiratory distress.     Breath sounds: Normal breath sounds. No wheezing.  Abdominal:     General: Bowel sounds are normal. There is no distension.     Palpations: Abdomen is soft.     Tenderness: There is no abdominal tenderness.  Musculoskeletal:        General: No tenderness. Normal range of motion.     Cervical back: Normal range of motion and neck supple.     Comments: Full ROM, pain in right flank with rotation  Skin:    General: Skin is warm and dry.     Findings: No erythema or rash.  Neurological:     Mental Status: He is alert and oriented to person, place, and time.     Cranial Nerves: No cranial nerve  deficit.     Deep Tendon Reflexes: Reflexes are normal and symmetric.  Psychiatric:        Behavior: Behavior normal.        Thought Content: Thought content normal.        Judgment: Judgment normal.       BP 125/79   Pulse (!) 58   Temp 98.1 F (36.7 C) (Temporal)   Ht 6' (1.829 m)   Wt 252 lb 12.8 oz (114.7 kg)   SpO2 95%   BMI 34.29 kg/m      Assessment & Plan:  Walter [name] comes in today with chief complaint of Back Pain (With tenderness in th groin. Patient c/o with urine frequency and and right flank pain. Off and on for 3 weeks.)   Diagnosis and orders addressed:  1. Flank pain (Primary) - Urinalysis, Complete - Urine Culture  2. Urinary frequency  3. Acute right-sided low back pain without sciatica Rest Start Celebrex  100 mg BID for 5-7 days, no other NSAID's  ROM exercises encouraged Ice  Follow up if symptoms worsen or do not improve  - baclofen (LIORESAL) 10 MG tablet; Take 1 tablet (10 mg total) by mouth 3 (three) times daily.  Dispense: 30 each; Refill: 0 - celecoxib  (CELEBREX ) 100 MG capsule; Take 1 capsule (100 mg total) by mouth 2 (two) times daily.  Dispense: 60 capsule; Refill: 1  4. Colon cancer screening - Cologuard    Tommas Fragmin, FNP

## 2023-06-26 LAB — URINE CULTURE

## 2023-07-01 ENCOUNTER — Ambulatory Visit: Payer: Self-pay | Admitting: Family

## 2023-09-13 ENCOUNTER — Ambulatory Visit: Payer: MEDICAID | Admitting: Family

## 2023-09-13 ENCOUNTER — Telehealth: Payer: Self-pay | Admitting: Family Medicine

## 2023-09-13 ENCOUNTER — Encounter: Payer: Self-pay | Admitting: Family

## 2023-09-13 NOTE — Telephone Encounter (Signed)
 Copied from CRM [id number]. Topic: General - Other >> Sep 13, 2023 11:05 AM Roselie BROCKS wrote: Reason for CRM: Patient wanted to leave a message to apologize for missing appointment this morning.

## 2023-11-25 ENCOUNTER — Ambulatory Visit: Payer: Self-pay

## 2023-11-25 NOTE — Telephone Encounter (Signed)
Noted  -LS

## 2023-11-25 NOTE — Telephone Encounter (Signed)
 FYI Only or Action Required?: FYI only for provider: appointment scheduled on 11/28/2023.  Patient was last seen in primary care on 06/25/2023 by [name], FNP.  Called Nurse Triage reporting Shoulder Pain.  Symptoms began several years ago.  Interventions attempted: OTC medications: ibuprofen .  Symptoms are: gradually worsening.  Triage Disposition: See PCP When Office is Open (Within 3 Days)  Patient/caregiver understands and will follow disposition?: Yes   Copied from CRM [id number]. Topic: Clinical - Red Word Triage >> Nov 25, 2023 10:42 AM Marda MATSU wrote: Red Word that prompted transfer to Nurse Triage:  Patient Walter [name] calling he is experiencing left side neck and shoulder pain and burning sensation. >> Nov 25, 2023 10:56 AM Marda G wrote: Patient did not want to hold on  and just wanted an appointment.  Reason for Disposition  [1] MODERATE pain (e.g., interferes with normal activities) AND [2] present > 3 days  Answer Assessment - Initial Assessment Questions 1. ONSET: When did the pain start?     Several years ago, but flared up about three years ago 2. LOCATION: Where is the pain located?     Left neck and shoulder radiates through tricep to pinky finger 3. PAIN: How bad is the pain? (Scale 1-10; or mild, moderate, severe)     Burning, tingling pain constant when driving 4. WORK OR EXERCISE: Has there been any recent work or exercise that involved this part of the body?     driving 5. CAUSE: What do you think is causing the shoulder pain?     Pinched nerve, driving 6. OTHER SYMPTOMS: Do you have any other symptoms? (e.g., neck pain, swelling, rash, fever, numbness, weakness)     denies  Protocols used: Shoulder Pain-A-AH

## 2023-11-28 ENCOUNTER — Ambulatory Visit (INDEPENDENT_AMBULATORY_CARE_PROVIDER_SITE_OTHER): Payer: MEDICAID

## 2023-11-28 ENCOUNTER — Encounter: Payer: Self-pay | Admitting: Family

## 2023-11-28 ENCOUNTER — Ambulatory Visit: Payer: MEDICAID | Admitting: Family

## 2023-11-28 VITALS — BP 133/81 | HR 79 | Temp 97.9°F | Ht 72.0 in | Wt 341.2 lb

## 2023-11-28 DIAGNOSIS — M5412 Radiculopathy, cervical region: Secondary | ICD-10-CM

## 2023-11-28 MED ORDER — CELECOXIB 100 MG PO CAPS: 100.0000 mg | ORAL_CAPSULE | Freq: Two times a day (BID) | ORAL | 1 refills | Status: AC

## 2023-11-28 MED ORDER — BACLOFEN 10 MG PO TABS: 10.0000 mg | ORAL_TABLET | Freq: Three times a day (TID) | ORAL | 0 refills | Status: AC

## 2023-11-28 MED ORDER — METHYLPREDNISOLONE ACETATE 80 MG/ML IJ SUSP
80.0000 mg | Freq: Once | INTRAMUSCULAR | Status: AC
  Administered 2023-11-28: 80 mg via INTRAMUSCULAR

## 2023-11-28 MED ORDER — METHYLPREDNISOLONE 4 MG PO TBPK: ORAL_TABLET | ORAL | 0 refills | Status: AC

## 2023-11-28 MED ORDER — GABAPENTIN 300 MG PO CAPS: 300.0000 mg | ORAL_CAPSULE | Freq: Three times a day (TID) | ORAL | 0 refills | Status: AC

## 2023-11-28 NOTE — Progress Notes (Signed)
 Subjective:    Patient ID: Walter [name], male    DOB: [birth year], 58 y.o.   MRN: [id number]  Chief Complaint  Patient presents with   Ear Pain   Neck Pain    And shoulder pain down to fingers    PT presents to the office today complaining of left neck that radiates down left arm. Reports this has been on and off years. Reports it will flare up after driving long hours, or using it for long periods of time.  Neck Pain  This is a chronic problem. The current episode started more than 1 year ago. The problem occurs intermittently. The problem has been waxing and waning. The pain is present in the left side. The quality of the pain is described as aching, burning and shooting. The pain is at a severity of 10/10 (at times). The pain is moderate. The symptoms are aggravated by twisting. Associated symptoms include tingling. Pertinent negatives include no fever, headaches, pain with swallowing, photophobia or trouble swallowing. He has tried bed rest, acetaminophen  and NSAIDs for the symptoms. The treatment provided mild relief.      Review of Systems  Constitutional:  Negative for fever.  HENT:  Negative for trouble swallowing.   Eyes:  Negative for photophobia.  Musculoskeletal:  Positive for neck pain.  Neurological:  Positive for tingling. Negative for headaches.  All other systems reviewed and are negative.   Social History   Socioeconomic History   Marital status: Significant Other    Spouse name: Not on file   Number of children: Not on file   Years of education: Not on file   Highest education level: Not on file  Occupational History   Not on file  Tobacco Use   Smoking status: Never   Smokeless tobacco: Never  Vaping Use   Vaping status: Never Used  Substance and Sexual Activity   Alcohol use: No    Comment:   in the past   Drug use: Yes    Types: Marijuana    Comment: smokes once a week per pt--02/23/2020   Sexual activity: Not on file  Other Topics Concern    Not on file  Social History Narrative   Not on file   Social Drivers of Health   Financial Resource Strain: Not on file  Food Insecurity: Not on file  Transportation Needs: Not on file  Physical Activity: Not on file  Stress: Not on file  Social Connections: Unknown (05/25/2021)   Received from [facility]   Social Network    Social Network: Not on file   Family History  Problem Relation Age of Onset   Healthy Mother    Healthy Sister    Healthy Brother    Healthy Brother         Objective:   Physical Exam Vitals reviewed.  Constitutional:      General: He is not in acute distress.    Appearance: He is well-developed. He is obese.  HENT:     Head: Normocephalic.     Right Ear: Tympanic membrane normal.     Left Ear: Tympanic membrane normal.  Eyes:     General:        Right eye: No discharge.        Left eye: No discharge.     Pupils: Pupils are equal, round, and reactive to light.  Neck:     Thyroid: No thyromegaly.  Cardiovascular:     Rate and Rhythm: Normal rate  and regular rhythm.     Heart sounds: Normal heart sounds. No murmur heard. Pulmonary:     Effort: Pulmonary effort is normal. No respiratory distress.     Breath sounds: Normal breath sounds. No wheezing.  Abdominal:     General: Bowel sounds are normal. There is no distension.     Palpations: Abdomen is soft.     Tenderness: There is no abdominal tenderness.  Musculoskeletal:        General: No tenderness. Normal range of motion.     Cervical back: Normal range of motion and neck supple.     Comments: Full ROM of neck   Skin:    General: Skin is warm and dry.     Findings: No erythema or rash.  Neurological:     Mental Status: He is alert and oriented to person, place, and time.     Cranial Nerves: No cranial nerve deficit.     Deep Tendon Reflexes: Reflexes are normal and symmetric.  Psychiatric:        Behavior: Behavior normal.        Thought Content: Thought content normal.         Judgment: Judgment normal.       BP 133/81   Pulse 79   Temp 97.9 F (36.6 C) (Temporal)   Ht 6' (1.829 m)   Wt (!) 341 lb 3.2 oz (154.8 kg)   SpO2 95%   BMI 46.28 kg/m      Assessment & Plan:  Walter [name] comes in today with chief complaint of Ear Pain and Neck Pain (And shoulder pain down to fingers )   Diagnosis and orders addressed:  1. Cervical radiculopathy (Primary) Rest ROM exercises  Start Celebrex  100 mg BID  Start Medrol tomorrow  Referral to PT X-ray pending  Start Gabapentin  300 mg TID prn, discussed sedation precautions  - DG Cervical Spine Complete; Future - methylPREDNISolone (MEDROL DOSEPAK) 4 MG TBPK tablet; Use directed  Dispense: 21 tablet; Refill: 0 - celecoxib  (CELEBREX ) 100 MG capsule; Take 1 capsule (100 mg total) by mouth 2 (two) times daily.  Dispense: 60 capsule; Refill: 1 - baclofen  (LIORESAL ) 10 MG tablet; Take 1 tablet (10 mg total) by mouth 3 (three) times daily.  Dispense: 30 each; Refill: 0 - methylPREDNISolone acetate (DEPO-MEDROL) injection 80 mg - Ambulatory referral to Physical Therapy - gabapentin  (NEURONTIN ) 300 MG capsule; Take 1 capsule (300 mg total) by mouth 3 (three) times daily.  Dispense: 90 capsule; Refill: 0     Bari Learn, FNP

## 2023-11-28 NOTE — Addendum Note (Signed)
 Addended by: LAVELL LYE A on: 11/28/2023 11:52 AM   Modules accepted: Level of Service

## 2023-11-28 NOTE — Patient Instructions (Signed)
Cervical Radiculopathy  Cervical radiculopathy happens when a nerve in the neck (a cervical nerve) is pinched or bruised. This condition can happen because of an injury to the cervical spine (vertebrae) in the neck, or as part of the normal aging process. Pressure on the cervical nerves can cause pain or numbness that travels from the neck all the way down to the arm and fingers. This condition usually gets better with rest. Treatment may be needed if the condition does not improve. What are the causes? This condition may be caused by: A neck injury. A bulging (herniated) disk. Muscle spasms. Muscle tightness in the neck due to overuse. Arthritis. Breakdown or degeneration in the bones and joints of the spine (spondylosis) due to aging. Bone spurs that may develop near the cervical nerves. What are the signs or symptoms? Symptoms of this condition include: Pain. The pain may travel from the neck to the arm and hand. The pain can be severe or irritating. It may get worse when you move your neck. Numbness or tingling in your arm or hand. Weakness in the affected arm and hand, in severe cases. How is this diagnosed? This condition may be diagnosed based on your symptoms, your medical history, and a physical exam. You may also have tests, including: X-rays. CT scan. MRI. Electromyogram (EMG). Nerve conduction tests. How is this treated? In many cases, treatment is not needed for this condition. With rest, the condition usually gets better over time. If treatment is needed, options may include: Wearing a soft neck collar (cervical collar) for short periods of time. Doing physical therapy to strengthen your neck muscles. Taking medicines. These may include NSAIDs, such as ibuprofen, or oral corticosteroids. Having spinal injections, in severe cases. Having surgery. This may be needed if other treatments do not help. Different types of surgery may be done depending on the cause of this  condition. Follow these instructions at home: If you have a cervical collar: Wear it as told by your health care provider. Remove it only as told by your health care provider. Ask your health care provider if you can remove the cervical collar for cleaning and bathing. If you are allowed to remove the collar for cleaning or bathing: Follow instructions from your health care provider about how to remove the collar safely. Clean the collar by wiping it with mild soap and water and drying it completely. Take out any removable pads in the collar every 1-2 days, and wash them by hand with soap and water. Let them air-dry completely before you put them back in the collar. Check your skin under the collar for irritation or sores. If you see any, tell your health care provider. Managing pain     Take over-the-counter and prescription medicines only as told by your health care provider. If directed, put ice on the affected area. To do this: If you have a soft neck collar, remove it as told by your health care provider. Put ice in a plastic bag. Place a towel between your skin and the bag. Leave the ice on for 20 minutes, 2-3 times a day. Remove the ice if your skin turns bright red. This is very important. If you cannot feel pain, heat, or cold, you have a greater risk of damage to the area. If applying ice does not help, you can try using heat. Use the heat source that your health care provider recommends, such as a moist heat pack or a heating pad. Place a towel between   your skin and the heat source. Leave the heat on for 20-30 minutes. Remove the heat if your skin turns bright red. This is especially important if you are unable to feel pain, heat, or cold. You have a greater risk of getting burned. Try a gentle neck and shoulder massage to help relieve symptoms. Activity Rest as needed. Return to your normal activities as told by your health care provider. Ask your health care provider what  activities are safe for you. Do stretching and strengthening exercises as told by your health care provider or your physical therapist. You may have to avoid lifting. Ask your health care provider how much you can safely lift. General instructions Use a flat pillow when you sleep. Do not drive while wearing a cervical collar. If you do not have a cervical collar, ask your health care provider if it is safe to drive while your neck heals. Ask your health care provider if the medicine prescribed to you requires you to avoid driving or using machinery. Do not use any products that contain nicotine or tobacco. These products include cigarettes, chewing tobacco, and vaping devices, such as e-cigarettes. If you need help quitting, ask your health care provider. Keep all follow-up visits. This is important. Contact a health care provider if: Your condition does not improve with treatment. Get help right away if: Your pain gets much worse and is not controlled with medicines. You have weakness or numbness in your hand, arm, face, or leg. You have a high fever. You have a stiff, rigid neck. You lose control of your bowels or your bladder (have incontinence). You have trouble with walking, balance, or speaking. Summary Cervical radiculopathy happens when a nerve in the neck is pinched or bruised. A nerve can get pinched from a bulging disk, arthritis, muscle spasms, or an injury to the neck. Symptoms include pain, tingling, or numbness radiating from the neck to the arm or hand. Weakness can also occur in severe cases. Treatment may include rest, wearing a cervical collar, and physical therapy. Medicines may be prescribed to help with pain. In severe cases, injections or surgery may be needed. This information is not intended to replace advice given to you by your health care provider. Make sure you discuss any questions you have with your health care provider. Document Revised: 07/14/2020 Document  Reviewed: 07/14/2020 Elsevier Patient Education  2024 [employer]  

## 2023-12-05 ENCOUNTER — Ambulatory Visit: Payer: Self-pay | Admitting: Family
